# Patient Record
Sex: Female | Born: 1935
Health system: Southern US, Community
[De-identification: ages and names within clinical notes are randomized; demographics above are authoritative.]

## PROBLEM LIST (undated history)

## (undated) DIAGNOSIS — M1990 Unspecified osteoarthritis, unspecified site: Secondary | ICD-10-CM

## (undated) DIAGNOSIS — M419 Scoliosis, unspecified: Secondary | ICD-10-CM

## (undated) DIAGNOSIS — I2510 Atherosclerotic heart disease of native coronary artery without angina pectoris: Secondary | ICD-10-CM

## (undated) DIAGNOSIS — D649 Anemia, unspecified: Secondary | ICD-10-CM

## (undated) DIAGNOSIS — E039 Hypothyroidism, unspecified: Secondary | ICD-10-CM

## (undated) DIAGNOSIS — I10 Essential (primary) hypertension: Secondary | ICD-10-CM

## (undated) DIAGNOSIS — I5189 Other ill-defined heart diseases: Secondary | ICD-10-CM

## (undated) DIAGNOSIS — I519 Heart disease, unspecified: Secondary | ICD-10-CM

## (undated) DIAGNOSIS — R5383 Other fatigue: Secondary | ICD-10-CM

## (undated) DIAGNOSIS — M722 Plantar fascial fibromatosis: Secondary | ICD-10-CM

## (undated) DIAGNOSIS — R0602 Shortness of breath: Secondary | ICD-10-CM

## (undated) HISTORY — PX: OTHER SURGICAL HISTORY: SHX169

## (undated) HISTORY — DX: Scoliosis, unspecified: M41.9

## (undated) HISTORY — DX: Heart disease, unspecified: I51.9

## (undated) HISTORY — DX: Other fatigue: R53.83

## (undated) HISTORY — DX: Other ill-defined heart diseases: I51.89

## (undated) HISTORY — PX: TONSILECTOMY, ADENOIDECTOMY, BILATERAL MYRINGOTOMY AND TUBES: SHX2538

## (undated) HISTORY — DX: Plantar fascial fibromatosis: M72.2

## (undated) HISTORY — PX: ABDOMINAL HYSTERECTOMY: SHX81

## (undated) HISTORY — PX: APPENDECTOMY: SHX54

## (undated) HISTORY — DX: Atherosclerotic heart disease of native coronary artery without angina pectoris: I25.10

## (undated) HISTORY — DX: Shortness of breath: R06.02

---

## 1998-02-10 ENCOUNTER — Other Ambulatory Visit: Admission: RE | Admit: 1998-02-10 | Discharge: 1998-02-10 | Payer: Self-pay | Admitting: Dermatology

## 1999-11-27 ENCOUNTER — Other Ambulatory Visit: Admission: RE | Admit: 1999-11-27 | Discharge: 1999-11-27 | Payer: Self-pay | Admitting: Obstetrics & Gynecology

## 2001-05-15 ENCOUNTER — Encounter: Payer: Self-pay | Admitting: Internal Medicine

## 2004-11-20 ENCOUNTER — Ambulatory Visit: Payer: Self-pay | Admitting: Internal Medicine

## 2004-11-27 ENCOUNTER — Ambulatory Visit: Payer: Self-pay | Admitting: Internal Medicine

## 2005-05-23 ENCOUNTER — Encounter: Admission: RE | Admit: 2005-05-23 | Discharge: 2005-05-23 | Payer: Self-pay | Admitting: Internal Medicine

## 2005-07-11 ENCOUNTER — Encounter: Admission: RE | Admit: 2005-07-11 | Discharge: 2005-07-11 | Payer: Self-pay | Admitting: Neurology

## 2006-01-30 ENCOUNTER — Other Ambulatory Visit: Admission: RE | Admit: 2006-01-30 | Discharge: 2006-01-30 | Payer: Self-pay | Admitting: Obstetrics & Gynecology

## 2006-10-31 ENCOUNTER — Ambulatory Visit: Payer: Self-pay | Admitting: Internal Medicine

## 2006-11-14 ENCOUNTER — Ambulatory Visit: Payer: Self-pay | Admitting: Internal Medicine

## 2007-06-05 ENCOUNTER — Encounter: Admission: RE | Admit: 2007-06-05 | Discharge: 2007-06-05 | Payer: Self-pay | Admitting: Obstetrics & Gynecology

## 2007-11-27 ENCOUNTER — Encounter: Admission: RE | Admit: 2007-11-27 | Discharge: 2007-11-27 | Payer: Self-pay | Admitting: Internal Medicine

## 2009-11-14 DIAGNOSIS — D649 Anemia, unspecified: Secondary | ICD-10-CM | POA: Insufficient documentation

## 2009-11-14 DIAGNOSIS — E039 Hypothyroidism, unspecified: Secondary | ICD-10-CM | POA: Insufficient documentation

## 2009-11-14 DIAGNOSIS — M8580 Other specified disorders of bone density and structure, unspecified site: Secondary | ICD-10-CM | POA: Insufficient documentation

## 2009-11-14 DIAGNOSIS — M419 Scoliosis, unspecified: Secondary | ICD-10-CM | POA: Insufficient documentation

## 2009-11-14 DIAGNOSIS — I10 Essential (primary) hypertension: Secondary | ICD-10-CM | POA: Insufficient documentation

## 2010-02-14 ENCOUNTER — Encounter: Payer: Self-pay | Admitting: Internal Medicine

## 2010-02-22 ENCOUNTER — Telehealth (INDEPENDENT_AMBULATORY_CARE_PROVIDER_SITE_OTHER): Payer: Self-pay | Admitting: *Deleted

## 2010-02-26 ENCOUNTER — Telehealth: Payer: Self-pay | Admitting: Internal Medicine

## 2010-02-28 ENCOUNTER — Telehealth: Payer: Self-pay | Admitting: Internal Medicine

## 2010-03-29 ENCOUNTER — Telehealth: Payer: Self-pay | Admitting: Internal Medicine

## 2010-04-16 ENCOUNTER — Ambulatory Visit: Payer: Self-pay | Admitting: Internal Medicine

## 2010-04-16 DIAGNOSIS — D509 Iron deficiency anemia, unspecified: Secondary | ICD-10-CM | POA: Insufficient documentation

## 2010-04-16 DIAGNOSIS — R195 Other fecal abnormalities: Secondary | ICD-10-CM | POA: Insufficient documentation

## 2010-05-07 ENCOUNTER — Ambulatory Visit: Payer: Self-pay | Admitting: Internal Medicine

## 2010-05-07 DIAGNOSIS — K2990 Gastroduodenitis, unspecified, without bleeding: Secondary | ICD-10-CM

## 2010-05-07 DIAGNOSIS — K2970 Gastritis, unspecified, without bleeding: Secondary | ICD-10-CM | POA: Insufficient documentation

## 2010-05-17 ENCOUNTER — Telehealth: Payer: Self-pay | Admitting: Internal Medicine

## 2010-05-22 ENCOUNTER — Encounter: Payer: Self-pay | Admitting: Internal Medicine

## 2010-05-30 ENCOUNTER — Telehealth: Payer: Self-pay | Admitting: Internal Medicine

## 2010-06-01 ENCOUNTER — Ambulatory Visit: Payer: Self-pay | Admitting: Internal Medicine

## 2010-06-01 LAB — CONVERTED CEMR LAB
Basophils Absolute: 0 10*3/uL (ref 0.0–0.1)
Basophils Relative: 1 % (ref 0.0–3.0)
Lymphocytes Relative: 12.9 % (ref 12.0–46.0)
Lymphs Abs: 0.5 10*3/uL — ABNORMAL LOW (ref 0.7–4.0)
Platelets: 181 10*3/uL (ref 150.0–400.0)
RDW: 14.2 % (ref 11.5–14.6)
Saturation Ratios: 5.7 % — ABNORMAL LOW (ref 20.0–50.0)
Transferrin: 273.9 mg/dL (ref 212.0–360.0)
Vitamin B-12: 803 pg/mL (ref 211–911)
WBC: 3.6 10*3/uL — ABNORMAL LOW (ref 4.5–10.5)

## 2010-06-14 ENCOUNTER — Ambulatory Visit: Payer: Self-pay | Admitting: Internal Medicine

## 2010-06-18 ENCOUNTER — Telehealth (INDEPENDENT_AMBULATORY_CARE_PROVIDER_SITE_OTHER): Payer: Self-pay | Admitting: *Deleted

## 2010-07-09 ENCOUNTER — Telehealth: Payer: Self-pay | Admitting: Internal Medicine

## 2010-07-12 ENCOUNTER — Telehealth (INDEPENDENT_AMBULATORY_CARE_PROVIDER_SITE_OTHER): Payer: Self-pay | Admitting: *Deleted

## 2010-07-12 ENCOUNTER — Telehealth (INDEPENDENT_AMBULATORY_CARE_PROVIDER_SITE_OTHER): Payer: Self-pay

## 2010-07-18 ENCOUNTER — Ambulatory Visit: Payer: Self-pay | Admitting: Internal Medicine

## 2010-07-18 LAB — CONVERTED CEMR LAB
Eosinophils Absolute: 0.1 10*3/uL (ref 0.0–0.7)
Eosinophils Relative: 2.3 % (ref 0.0–5.0)
Lymphs Abs: 0.6 10*3/uL — ABNORMAL LOW (ref 0.7–4.0)
Neutrophils Relative %: 68.3 % (ref 43.0–77.0)
Platelets: 175 10*3/uL (ref 150.0–400.0)
RBC: 3.89 M/uL (ref 3.87–5.11)

## 2010-08-17 ENCOUNTER — Ambulatory Visit: Payer: Self-pay | Admitting: Internal Medicine

## 2010-08-17 LAB — CONVERTED CEMR LAB
Basophils Relative: 0.7 % (ref 0.0–3.0)
HCT: 38.7 % (ref 36.0–46.0)
Hemoglobin: 12.9 g/dL (ref 12.0–15.0)
Lymphocytes Relative: 16.4 % (ref 12.0–46.0)
Lymphs Abs: 0.6 10*3/uL — ABNORMAL LOW (ref 0.7–4.0)
MCHC: 33.4 g/dL (ref 30.0–36.0)
MCV: 86.9 fL (ref 78.0–100.0)
Monocytes Relative: 11.2 % (ref 3.0–12.0)
Neutro Abs: 2.6 10*3/uL (ref 1.4–7.7)
Neutrophils Relative %: 68.2 % (ref 43.0–77.0)
Platelets: 181 10*3/uL (ref 150.0–400.0)
RDW: 19.4 % — ABNORMAL HIGH (ref 11.5–14.6)
WBC: 3.9 10*3/uL — ABNORMAL LOW (ref 4.5–10.5)

## 2010-08-22 ENCOUNTER — Ambulatory Visit: Payer: Self-pay | Admitting: Internal Medicine

## 2010-08-22 DIAGNOSIS — K5520 Angiodysplasia of colon without hemorrhage: Secondary | ICD-10-CM | POA: Insufficient documentation

## 2010-11-20 NOTE — Assessment & Plan Note (Signed)
Summary: Iron deficiency anemia-followup   History of Present Illness Visit Type: Follow-up Visit Primary GI MD: Yancey Flemings MD Primary Provider: Geoffry Paradise, MD Requesting Provider: n/a Chief Complaint: F/u for labs. Pt states that she is feeling better and denies any GI complaints  History of Present Illness:   75 year old female with hypertension, hypothyroidism, anxiety disorder, and iron deficiency anemia. She was evaluated late June for Hemoccult positive stool and iron deficiency anemia. She subsequently underwent colonoscopy and upper endoscopy. Colonoscopy was normal except for diminutive polyp. The ileum was normal. Upper endoscopy was normal except for mild esophagitis. Finally, she underwent capsule endoscopy. This revealed several AVMs. One large. Nonbleeding. She was seen one month ago. Hemoglobin at that time 10.6. She was changed aren't sulfate 325 mg b.i.d. Repeat CBC and ferritin the other day were normal. Her hemoglobin was 12.9 and ferritin level 14.5. She presents today to discuss these results. She tells me that she's feeling much better. Improve strength. Improved exercise tolerance. No other issues. We reviewed in detail iron deficiency anemia as well as the significance of vascular malformations as they're related to iron deficiency anemia and the treatment algorithm.   GI Review of Systems      Denies abdominal pain, acid reflux, belching, bloating, chest pain, dysphagia with liquids, dysphagia with solids, heartburn, loss of appetite, nausea, vomiting, vomiting blood, weight loss, and  weight gain.        Denies anal fissure, black tarry stools, change in bowel habit, constipation, diarrhea, diverticulosis, fecal incontinence, heme positive stool, hemorrhoids, irritable bowel syndrome, jaundice, light color stool, liver problems, rectal bleeding, and  rectal pain.    Current Medications (verified): 1)  Diovan Hct 320-25 Mg Tabs (Valsartan-Hydrochlorothiazide) ....  Take 1 Tablet By Mouth Once A Day 2)  Synthroid 50 Mcg Tabs (Levothyroxine Sodium) .... Take 1 Tablet By Mouth     Once Daily 3)  Aspirin 81 Mg Tbec (Aspirin) .... Take 1 Tablet By Mouth Once Daily 4)  Trazodone Hcl 100 Mg Tabs (Trazodone Hcl) .... Take 1 Tablet At Bedtime For Pain 5)  Fish Oil 1000 Mg Caps (Omega-3 Fatty Acids) .... Once Daily 6)  Multivitamins  Tabs (Multiple Vitamin) .... Once Daily 7)  Ferrous Sulfate 325 (65 Fe) Mg Tabs (Ferrous Sulfate) .... One Tablet By Mouth Two Times A Day  Allergies (verified): 1)  Pcn 2)  * Ivp Dye  Past History:  Past Medical History: Scoliosis/DDD Osteopenia Hypertension Hypothyroidism Anxiety Disorder GASTRITIS (ICD-535.50) ANEMIA, IRON DEFICIENCY (ICD-280.9) Diverticulosis--Colon 2008  Hx Colon Polyps Esophagitis  Past Surgical History: Reviewed history from 04/16/2010 and no changes required. Appendectomy Hysterectomy-Abdominal Tonsillectomy & Adenoidectomy Knee Arthroscopy-right  Family History: Family History of Heart Disease: Mother-MI, deceased Family History of Lung Cancer: Father-deceased No FH of Colon Cancer:  Social History: Reviewed history from 04/16/2010 and no changes required. Married, 2 boys Retired Charity fundraiser Alcohol Use - yes - occasional  Patient has never smoked.  Daily Caffeine Use Illicit Drug Use - no  Review of Systems       The patient complains of anemia and fatigue.  The patient denies allergy/sinus, anxiety-new, arthritis/joint pain, back pain, blood in urine, breast changes/lumps, change in vision, confusion, cough, coughing up blood, depression-new, fainting, fever, headaches-new, hearing problems, heart murmur, heart rhythm changes, itching, menstrual pain, muscle pains/cramps, night sweats, nosebleeds, pregnancy symptoms, shortness of breath, skin rash, sleeping problems, sore throat, swelling of feet/legs, swollen lymph glands, thirst - excessive , urination - excessive , urination  changes/pain, urine  leakage, vision changes, and voice change.    Vital Signs:  Patient profile:   75 year old female Height:      62 inches Weight:      129 pounds BMI:     23.68 BSA:     1.59 Pulse rate:   64 / minute Pulse rhythm:   regular BP sitting:   112 / 64  (left arm) Cuff size:   regular  Vitals Entered By: Ok Anis CMA (August 22, 2010 8:33 AM)  Physical Exam  General:  Well developed, well nourished, no acute distress. Head:  Normocephalic and atraumatic. Eyes:  PERRLA, no icterus. Abdomen:  not reexamined Pulses:  Normal pulses noted. Neurologic:  Alert and  oriented x4. Skin:  normal color Psych:  Alert and cooperative. Normal mood and affect.   Impression & Recommendations:  Problem # 1:  ANGIODYSPLASIA-INTESTINE (ICD-569.84) AVMs of the small intestine as a cause for recurrent iron deficiency anemia (280.9) and Hemoccult-positive stool (792.1). She has improved on oral iron therapy. We discussed her condition in detail as mentioned in the history of present illness.  Plan #1. Continue iron sulfate 325 mg p.o. b.i.d. indefinitely #2. Have Dr. Jacky Kindle monitor your blood counts periodically, said about every 3 months, to assure stability. Sooner should she develop symptoms suggesting recurrent anemia #3. GI followup p.r.n.  Patient Instructions: 1)  Copy sent to : Geoffry Paradise, MD 2)  Please schedule a follow-up appointment as needed.  3)  The medication list was reviewed and reconciled.  All changed / newly prescribed medications were explained.  A complete medication list was provided to the patient / caregiver.

## 2010-11-20 NOTE — Assessment & Plan Note (Signed)
Summary: anemia / heme positive stool   History of Present Illness Visit Type: Initial Consult Primary GI MD: Yancey Flemings MD Primary Provider: Geoffry Paradise, MD Requesting Provider: Geoffry Paradise, MD Chief Complaint: Anemia - hem positive stool History of Present Illness:   75 year old female with a history of hypertension, hypothyroidism, anxiety disorder, and iron deficiency anemia. She is sent today regarding recurrent iron deficiency anemia and newly found Hemoccult-positive stool. The patient initially underwent colonoscopy in July of 2002 to evaluate rectal bleeding. The examination revealed diverticulosis and internal hemorrhoids. She was subsequently evaluated in February 2006 for iron deficiency anemia. Upper endoscopy was performed and found to be normal. Duodenal biopsies were negative for sprue. She was placed on iron supplements. Her iron deficiency was felt possibly due to chronic blood donation. Her blood counts improved. However, recurrent iron deficiency anemia lead to colonoscopy in January 2008. Again, the examination revealed diverticulosis. Since that time the patient has been on once daily iron. Her hemoglobin is generally in the 12-13 range. It was 12.1 earlier this year. However, in April she felt quite fatigued. Repeat hemoglobin at that time was 8.3. She does have some intermittent dark stools which he attributes to iron. She underwent Hemoccult testing on 2 separate occasions. Cards were positive x3 twice. She denies overt bleeding and GI symptoms include dosing, increased intestinal gas, and occasional nausea. No pain, mostly weight loss, or hematochezia. No family history of colon cancer. A stick check of her hemoglobin earlier today was 12.1.   GI Review of Systems    Reports belching, bloating, and  nausea.      Denies abdominal pain, acid reflux, chest pain, dysphagia with liquids, dysphagia with solids, heartburn, loss of appetite, vomiting, vomiting blood, weight  loss, and  weight gain.      Reports black tarry stools, diverticulosis, and  heme positive stool.     Denies anal fissure, change in bowel habit, constipation, diarrhea, fecal incontinence, hemorrhoids, irritable bowel syndrome, jaundice, light color stool, liver problems, rectal bleeding, and  rectal pain. Preventive Screening-Counseling & Management  Alcohol-Tobacco     Smoking Status: never      Drug Use:  no.      Current Medications (verified): 1)  Diovan Hct 320-25 Mg Tabs (Valsartan-Hydrochlorothiazide) .... Take 1 Tablet By Mouth Once A Day 2)  Synthroid 50 Mcg Tabs (Levothyroxine Sodium) .... Take 1 Tablet By Mouth     Once Daily 3)  Aspirin 81 Mg Tbec (Aspirin) .... Take 1 Tablet By Mouth Once Daily 4)  Trazodone Hcl 100 Mg Tabs (Trazodone Hcl) .... Take 1 Tablet At Bedtime For Pain 5)  Fish Oil 1000 Mg Caps (Omega-3 Fatty Acids) .... Once Daily 6)  Integra Plus  Caps (Fefum-Fepoly-Fa-B Cmp-C-Biot) .... Take 2 Capsule By Mouth Once Daily 7)  Multivitamins  Tabs (Multiple Vitamin) .... Once Daily  Allergies (verified): 1)  Pcn 2)  * Ivp Dye  Past History:  Past Medical History: Scoliosis/DDD Osteopenia Anemia Hypertension Hypothyroidism Anxiety Disorder  Past Surgical History: Appendectomy Hysterectomy-Abdominal Tonsillectomy & Adenoidectomy Knee Arthroscopy-right  Family History: Reviewed history from 04/11/2010 and no changes required. 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  Daily Caffeine Use Illicit Drug Use - no Smoking Status:  never Drug Use:  no  Review of Systems       The patient complains of arthritis/joint  pain, back pain, fatigue, and shortness of breath.  The patient denies allergy/sinus, anemia, anxiety-new, blood in urine, breast changes/lumps, change in vision, confusion,  cough, coughing up blood, depression-new, fainting, fever, headaches-new, hearing problems, heart murmur, heart rhythm changes, itching, menstrual pain, muscle pains/cramps, night sweats, nosebleeds, pregnancy symptoms, skin rash, sleeping problems, sore throat, swelling of feet/legs, swollen lymph glands, thirst - excessive , urination - excessive , urination changes/pain, urine leakage, vision changes, and voice change.    Vital Signs:  Patient profile:   75 year old female Height:      62 inches Weight:      125.38 pounds BMI:     23.02 Pulse rate:   76 / minute Pulse rhythm:   regular BP sitting:   120 / 70  (left arm) Cuff size:   regular  Vitals Entered By: June McMurray CMA Duncan Dull) (April 16, 2010 1:29 PM)  Physical Exam  General:  Well developed, well nourished, no acute distress. Head:  Normocephalic and atraumatic. Eyes:  PERRLA, no icterus.conjunctiva pink Nose:  No deformity, discharge,  or lesions. Mouth:  No deformity or lesions, Neck:  Supple; no masses or thyromegaly. Lungs:  Clear throughout to auscultation. Heart:  Regular rate and rhythm; no murmurs, rubs,  or bruits. Abdomen:  Soft, nontender and nondistended. No masses, hepatosplenomegaly or hernias noted. Normal bowel sounds. Rectal:  deferred until colonoscopy Msk:  Symmetrical with no gross deformities. Normal posture. Pulses:  Normal pulses noted. Extremities:  No clubbing, cyanosis, edema or deformities noted. Neurologic:  Alert and  oriented x4 Skin:  Intact without significant lesions or rashes. Psych:  Alert and cooperative. Normal mood and affect.   Impression & Recommendations:  Problem # 1:  ANEMIA, IRON DEFICIENCY (ICD-280.9) recurrent iron deficiency anemia. Now with Hemoccult positive stool (792.1). Prior upper endoscopy and colonoscopy as described. Significant change in hemoglobin during the first part of this year, question subacute bleeding. Concerned that she may have occult small bowel  lesion. Need to reevaluate the entire gastrointestinal tract.  Plan: #1. Colonoscopy. The nature of the procedure as well as the risks, benefits, and alternatives were reviewed. She understood and agreed to proceed #2. Movi prep prescribed. The patient instructed on its use. #3. Upper endoscopy. The nature of the procedure as well as the risks, benefits, and alternatives were reviewed. She understood and agreed to proceed. #4. Obtain tissue transglutaminase antibody and serum IgA level to screen for sprue #5. If the above workup negative, then capsule endoscopy. We discussed this as well.  Problem # 2:  FECAL OCCULT BLOOD (ICD-792.1) see the above discussion  Other Orders: T-Tissue Transglutamase Ab IgA (36644-03474) TLB-IgA (Immunoglobulin A) (82784-IGA) Colon/Endo (Colon/Endo)  Patient Instructions: 1)  Colon/Endo [facility] 05/07/10 2:30 pm arrive at 1:30 pm 2)  Hold Iron 1 week prior 3)  Colonoscopy and Flexible Sigmoidoscopy brochure given.  4)  Upper Endoscopy brochure given.  5)  Labs ordered for you to have drawn today on basement floor. 6)  Movi prep instructions given and Movi prep Rx. sent to pharmacy. 7)  Copy sent to : Geoffry Paradise, MD 8)  The medication list was reviewed and reconciled.  All changed / newly prescribed medications were explained.  A complete medication list was provided to the patient / caregiver. Prescriptions: MOVIPREP 100 GM  SOLR (PEG-KCL-NACL-NASULF-NA ASC-C) As per prep instructions.  #1 x 0   Entered by:   Milford Cage NCMA   Authorized by:   Hilarie Fredrickson MD   Signed  by:   Milford Cage NCMA on 04/16/2010   Method used:   Electronically to        [facility]. [phone]* (retail)       [phone]       [place], [place]  [address]       Ph: [phone]       Fax: [phone]   RxID:   [id number]

## 2010-11-20 NOTE — Procedures (Signed)
Summary: EGD   EGD  Procedure date:  11/27/2004  Findings:      Findings: Normal  Location: [facility]   Patient Name: Monique Wright, Monique Wright MRN:  Procedure Procedures: Panendoscopy (EGD) CPT: 43235.  Personnel: Endoscopist: Wilhemina Bonito. Marina Goodell, MD.  Referred By: Freddy Finner, M.D.  Exam Location: Exam performed in Outpatient Clinic. Outpatient  Patient Consent: Procedure, Alternatives, Risks and Benefits discussed, consent obtained, from patient. Consent was obtained by the RN.  Indications  Evaluation of: Anemia,  with low ferritin. with low iron saturation. Microcytic.  History  Current Medications: Patient is not currently taking Coumadin.  Pre-Exam Physical: Performed Nov 27, 2004  Entire physical exam was normal.  Exam Exam Info: Maximum depth of insertion Duodenum, intended Duodenum. Patient position: on left side. Vocal cords visualized. Gastric retroflexion performed. Images taken. ASA Classification: II. Tolerance: excellent.  Sedation Meds: Patient assessed and found to be appropriate for moderate (conscious) sedation. Fentanyl 50 mcg. given IV. Versed 7 mg. given IV. Cetacaine Spray given aerosolized.  Monitoring: BP and pulse monitoring done. Oximetry used. Supplemental O2 given  Findings Normal: Proximal Esophagus to Jejunum.    Comments: Biopsies d2/d3 taken to r/o Sprue Assessment Normal examination.  Events  Unplanned Intervention: No unplanned interventions were required.  Unplanned Events: There were no complications. Plans Comments: Iron tid with meals Disposition: After procedure patient sent to recovery. After recovery patient sent home.  Comments: RETURN TO DR. Jacky Kindle OR [facility] TO MONITOR BLOOD COUNTS ON IRON  CC:   Geoffry Paradise, MD   Konrad Dolores, MD  This report was created from the original endoscopy report, which was reviewed and signed by the above listed endoscopist.

## 2010-11-20 NOTE — Miscellaneous (Signed)
Summary: Orders Update-Clotest  Clinical Lists Changes  Problems: Added new problem of GASTRITIS (ICD-535.50) Orders: Added new Test order of TLB-H Pylori Screen Gastric Biopsy (83013-CLOTEST) - Signed 

## 2010-11-20 NOTE — Progress Notes (Signed)
Summary: Reschedule REV  Phone Note Outgoing Call Call back at [facility] Phone [phone]   Call placed by: Darcey Nora RN, CGRN,  July 12, 2010 8:25 AM Call placed to: Patient Summary of Call: Left message for patient to call back to discuss her appointment on 07/18/10.  She was placed on the wrong schedule and I have had to reschedule her to 07/18/10 at 4:00.  I have asked her to call back and confirm she got the message. Initial call taken by: Darcey Nora RN, CGRN,  July 12, 2010 8:27 AM  Follow-up for Phone Call        patient's husband aware of the change in schedule. Follow-up by: Darcey Nora RN, CGRN,  July 12, 2010 3:10 PM

## 2010-11-20 NOTE — Progress Notes (Signed)
Summary: Triage  Phone Note From Other Clinic   Caller: B.J. @ [facility] [phone] Call For: Dr. Marina Goodell Summary of Call: Would like to speak directly with nurse about this pt. Initial call taken by: Karna Christmas,  March 29, 2010 1:00 PM  Follow-up for Phone Call        returned call the office was closed for lunch.  Chales Abrahams CMA Duncan Dull)  March 29, 2010 1:46 PM   Spoke with BJ at [facility] she has gotten calls from the pt stating that there office nor ours has done anything to help the pt.  BJ was inquiring if an appt was made with Dr Marina Goodell.  I explained that an appt was made for the pt for 03/27/10.  The pt called back and rescheduled for 04/16/10.  The pt then did not want to wait that long for the appt but per Dr Marina Goodell the appt was not changed because that was the first available and the pt had cx her earlier appt.  BJ thanked me for my help. Follow-up by: Chales Abrahams CMA Duncan Dull),  March 29, 2010 2:25 PM

## 2010-11-20 NOTE — Procedures (Signed)
Summary: CAPSULE ENDOSCOPY REPORT   ORDERED BY: Yancey Flemings, MD READ BY: Yancey Flemings, MD  REASON FOR REFERRAL: 74YO FEMALE WITH AN IRON DEFICIENCY ANEMIA, AND HEME POSITIVE STOOL.  PROCEDURE INFO & FINDINGS: 1)COMPLETE STUDY, GOOD PREP OVERALL 2)SEVERAL TINY AVMS, ONE LARGE AVM AT 47 MINUTES, AND ONE SMALL AVM AT 2 HOURS, 34 MINUTES.  SUMMARY & RECOMMENDATIONS: 1.WILL NEED CHRONIC IRON THERAPY AND PERIODIC CHECK OF H/H. 2.CONSIDER ENTEROSCOPY AND ABLATION OF THE LARGE AVM IF PATIENT DEVELOPS SIGNIFICANT ANEMIA REFRACTORY TO IRON OR OVERT BLEEDING. 3.OFFICE FOLLOW UP WITH DR Marina Goodell CC:DR. RICHARD ARONSON   This report was created from the original endoscopy report, which was reviewed and signed by the above listed endoscopist.

## 2010-11-20 NOTE — Letter (Signed)
Summary: [facility] Instructions  [facility]  [address] [place], [place] [address]   Phone: [phone]  Fax: [phone]       Monique Wright    [birth year]    MRN: [id number]        Procedure Day /Date:MONDAY, 05/07/10     Arrival Time:1:30 PM     Procedure Time:2:30 PM     Location of Procedure:                    X  [facility] (4th Floor)                        PREPARATION FOR COLONOSCOPY WITH MOVIPREP/ENDO   Starting 5 days prior to your procedure 05/02/10 do not eat nuts, seeds, popcorn, corn, beans, peas,  salads, or any raw vegetables.  Do not take any fiber supplements (e.g. Metamucil, Citrucel, and Benefiber).  THE DAY BEFORE YOUR PROCEDURE         DATE: 05/06/10  DAY: SUNDAY  1.  Drink clear liquids the entire day-NO SOLID FOOD  2.  Do not drink anything colored red or purple.  Avoid juices with pulp.  No orange juice.  3.  Drink at least 64 oz. (8 glasses) of fluid/clear liquids during the day to prevent dehydration and help the prep work efficiently.  CLEAR LIQUIDS INCLUDE: Water Jello Ice Popsicles Tea (sugar ok, no milk/cream) Powdered fruit flavored drinks Coffee (sugar ok, no milk/cream) Gatorade Juice: apple, white grape, white cranberry  Lemonade Clear bullion, consomm, broth Carbonated beverages (any kind) Strained chicken noodle soup Hard Candy                             4.  In the morning, mix first dose of MoviPrep solution:    Empty 1 Pouch A and 1 Pouch B into the disposable container    Add lukewarm drinking water to the top line of the container. Mix to dissolve    Refrigerate (mixed solution should be used within 24 hrs)  5.  Begin drinking the prep at 5:00 p.m. The MoviPrep container is divided by 4 marks.   Every 15 minutes drink the solution down to the next mark (approximately 8 oz) until the full liter is complete.   6.  Follow completed prep with 16 oz of clear liquid of your choice (Nothing  red or purple).  Continue to drink clear liquids until bedtime.  7.  Before going to bed, mix second dose of MoviPrep solution:    Empty 1 Pouch A and 1 Pouch B into the disposable container    Add lukewarm drinking water to the top line of the container. Mix to dissolve    Refrigerate  THE DAY OF YOUR PROCEDURE      DATE:05/07/10 DAY: MONDAY  Beginning at 9:30 a.m. (5 hours before procedure):         1. Every 15 minutes, drink the solution down to the next mark (approx 8 oz) until the full liter is complete.  2. Follow completed prep with 16 oz. of clear liquid of your choice.    3. You may drink clear liquids until12:30 PM (2 HOURS BEFORE PROCEDURE).   MEDICATION INSTRUCTIONS  Unless otherwise instructed, you should take regular prescription medications with a small sip of water   as early as possible the morning of your procedure.  HOLD IRON 1  WEEK PRIOR         OTHER INSTRUCTIONS  You will need a responsible adult at least 73 years of age to accompany you and drive you home.   This person must remain in the waiting room during your procedure.  Wear loose fitting clothing that is easily removed.  Leave jewelry and other valuables at home.  However, you may wish to bring a book to read or  an iPod/MP3 player to listen to music as you wait for your procedure to start.  Remove all body piercing jewelry and leave at home.  Total time from sign-in until discharge is approximately 2-3 hours.  You should go home directly after your procedure and rest.  You can resume normal activities the  day after your procedure.  The day of your procedure you should not:   Drive   Make legal decisions   Operate machinery   Drink alcohol   Return to work  You will receive specific instructions about eating, activities and medications before you leave.    The above instructions have been reviewed and explained to me by   _______________________    I fully understand  and can verbalize these instructions _____________________________ Date _________

## 2010-11-20 NOTE — Progress Notes (Signed)
Summary: Needs Labs  Phone Note Outgoing Call   Summary of Call: Pt. ntfd. that her [employer] has not approved payment for her capsule endo for tomorrow per Morrie Sheldon so cx. until pre-certification is rec'd. Initial call taken by: Teryl Lucy RN,  May 30, 2010 2:15 PM  Follow-up for Phone Call        Spoke with Deanna Artis, RN with [facility], She apologized for not contacting our office, it was on her "to do list." Cannot approve Capsule Endo without labs. Requesting iron studies over CBC, she stated H&H would need to be critically low to obtain approval unless we could show consistently low levels or significant drop. No relevant labs in EMR. Please obtain Iron Studies and then we can submit for authorization. Follow-up by: Dwan Bolt,  May 30, 2010 4:52 PM  Additional Follow-up for Phone Call Additional follow up Details #1::        Please  copy the Labs from Dr Jacky Kindle from 4,/27/2011, Hgb 8.7 and have pt come in for CBC,Fe,TIBC and B12. Additional Follow-up by: Hart Carwin MD,  May 30, 2010 5:47 PM    Additional Follow-up for Phone Call Additional follow up Details #2::    Message left for pt. to call me x2 this a.m.  Teryl Lucy RN  May 31, 2010 9:43 AM message left again to call back.   Teryl Lucy RN  May 31, 2010 3:07 PM Pt. ret'd. my call and will come this a.m. for addtional lab work requested by her insurance.Teryl Lucy RN  June 01, 2010 8:28 AM   Additional Follow-up for Phone Call Additional follow up Details #3:: Details for Additional Follow-up Action Taken: labs confirm iron deficiency, despite being on iron therapy! She has very signif anemia previously documented as well as heme positive stools! With unrevealing ECL she needs capsule endoscopy. Hilarie Fredrickson MD  June 01, 2010 1:15 PM   Labs were faxed by Morrie Sheldon to pt's. insurance co. as soon as results became available today. C.Lohr,R.N.Insurance appvd capsule and it is r/s to 06/14/10.Pt. aware.06/05/2010

## 2010-11-20 NOTE — Procedures (Signed)
Summary: Colonoscopy: Diverticulosis   Colonoscopy  Procedure date:  11/14/2006  Findings:      Results: Diverticulosis.       Location:  [facility].   Patient Name: Monique Wright, Monique Wright MRN:  Procedure Procedures: Colonoscopy CPT: [phone].  Personnel: Endoscopist: Wilhemina Bonito. Marina Goodell, MD.  Referred By: Pearletha Furl Jacky Kindle, MD.  Exam Location: Exam performed in Outpatient Clinic. Outpatient  Patient Consent: Procedure, Alternatives, Risks and Benefits discussed, consent obtained, from patient. Consent was obtained by the RN.  Indications  Evaluation of: Anemia with low iron saturation. Microcytic.  History  Current Medications: Patient is not currently taking Coumadin.  Pre-Exam Physical: Performed Nov 14, 2006. Cardio-pulmonary exam, Rectal exam, HEENT exam , Abdominal exam, Mental status exam WNL.  Comments: Pt. history reviewed/updated, physical exam performed prior to initiation of sedation?yes Exam Exam: Extent of exam reached: Cecum, extent intended: Cecum.  The cecum was identified by appendiceal orifice and IC valve. Patient position: on left side. Time to Cecum: 00:09:58. Time for Withdrawl: 00:22:15. Colon retroflexion performed. Images taken. ASA Classification: II. Tolerance: excellent.  Monitoring: Pulse and BP monitoring, Oximetry used. Supplemental O2 given.  Colon Prep Used Miralax for colon prep. Prep results: fair, adequate exam.  Sedation Meds: Patient assessed and found to be appropriate for moderate (conscious) sedation. Fentanyl 75 mcg. given IV. Versed 8 mg. given IV.  Findings NORMAL EXAM: Cecum to Rectum.  - DIVERTICULOSIS: Descending Colon to Sigmoid Colon. ICD9: Diverticulosis, Colon: 562.10. Comments: mild.   Assessment  Diagnoses: 562.10: Diverticulosis, Colon.   Comments: NO POLYPS OR AVM'S Events  Unplanned Interventions: No intervention was required.  Unplanned Events: There were no  complications. Plans Disposition: After procedure patient sent to recovery. After recovery patient sent home.  Scheduling/Referral: Follow-Up prn.  Comments: RETURN TO THE CARE OF DR. Jacky Kindle  CC:   Geoffry Paradise, MD  This report was created from the original endoscopy report, which was reviewed and signed by the above listed endoscopist.

## 2010-11-20 NOTE — Procedures (Signed)
Summary: Colonoscopy: Diverticulosis, Hemorrhoids   Colonoscopy  Procedure date:  05/15/2001  Findings:      Results: Hemorrhoids.     Results: Diverticulosis.       Location:  [facility].   Patient Name: Monique Wright, Monique Wright MRN:  Procedure Procedures: Colonoscopy CPT: [phone].  Personnel: Endoscopist: Wilhemina Bonito. Marina Goodell, MD.  Referred By: Pearletha Furl Jacky Kindle, MD.  Exam Location: Exam performed in Outpatient Clinic. Outpatient  Patient Consent: Procedure, Alternatives, Risks and Benefits discussed, consent obtained, from patient.  Indications Symptoms: Hematochezia.  Average Risk Screening Routine.  History  Pre-Exam Physical: Performed May 15, 2001. Cardio-pulmonary exam, Rectal exam, HEENT exam , Abdominal exam, Extremity exam, Neurological exam, Mental status exam WNL.  Exam Exam: Extent of exam reached: Cecum, extent intended: Cecum.  The cecum was identified by appendiceal orifice and IC valve. Patient position: on left side. Colon retroflexion performed. Images taken. ASA Classification: II. Tolerance: excellent.  Monitoring: Pulse and BP monitoring, Oximetry used. Supplemental O2 given.  Colon Prep Used Golytely for colon prep. Prep results: good.  Sedation Meds: Patient assessed and found to be appropriate for moderate (conscious) sedation. Fentanyl 100 mcg. Versed 7 mg.  Findings - DIVERTICULOSIS: Descending Colon to Sigmoid Colon. ICD9: Diverticulosis, Colon: 562.10.  NORMAL EXAM: Cecum.  HEMORRHOIDS: Internal. ICD9: Hemorrhoids, Internal: 455.0.   Assessment Abnormal examination, see findings above.  Diagnoses: 562.10: Diverticulosis, Colon.  455.0: Hemorrhoids, Internal.   Events  Unplanned Interventions: No intervention was required.  Unplanned Events: There were no complications. Plans Patient Education: Patient given standard instructions for: Diverticulosis.  Disposition: After procedure patient sent to recovery. After  recovery patient sent home.  Scheduling/Referral: Colonoscopy, to Wilhemina Bonito. Marina Goodell, MD, at age 75 if medically fit,   Comments: Return to the care of Dr Jacky Kindle   CC:   Geoffry Paradise, MD  This report was created from the original endoscopy report, which was reviewed and signed by the above listed endoscopist.

## 2010-11-20 NOTE — Progress Notes (Signed)
  Phone Note Outgoing Call   Summary of Call: Per request of Amy Esterwoo,P.A  I checked  with pt's husband and she is taking her iron b.i.d. Initial call taken by: Teryl Lucy RN,  July 12, 2010 3:29 PM     Appended Document:  Needs CBCD prior to office visit per Amy Esterwoo,N.P.  Appended Document:  Pt. will come for lab work in am of her rov.

## 2010-11-20 NOTE — Procedures (Signed)
Summary: Colonoscopy  Patient: Monique Wright Note: All result statuses are Final unless otherwise noted.  Tests: (1) Colonoscopy (COL)   COL Colonoscopy           DONE     [facility]     [address]     [place], [place]  [address]           COLONOSCOPY PROCEDURE REPORT           PATIENT:  Monique Wright, Monique Wright  MR#:  [id number]     BIRTHDATE:  [birth year], 74 yrs. old  GENDER:  female     ENDOSCOPIST:  Wilhemina Bonito. Eda Keys, MD     REF. BY:  Office     PROCEDURE DATE:  05/07/2010     PROCEDURE:  Colonoscopy with snare polypectomy X 1     ASA CLASS:  Class II     INDICATIONS:  Iron deficiency anemia, heme positive stool     MEDICATIONS:   Fentanyl 100 mcg IV, Versed 10 mg IV           DESCRIPTION OF PROCEDURE:   After the risks benefits and     alternatives of the procedure were thoroughly explained, informed     consent was obtained.  Digital rectal exam was performed and     revealed no abnormalities.   The LB [id number]-[id number] endoscope     was introduced through the anus and advanced to the cecum, which     was identified by both the appendix and ileocecal valve, without     limitations. TIME TO CECUM = 4:55 MIN. The quality of the prep was     good, using MoviPrep.  The instrument was then slowly withdrawn     (T= 16:27 MIN) as the colon was fully examined.     <<PROCEDUREIMAGES>>           FINDINGS:  A < 1mm polyp was found in the rectum. Polyp was snared     without cautery. Retrieval was unsuccessful as the polyp was too     tiny.  A normal appearing cecum, ileocecal valve, and appendiceal     orifice were identified. The ascending, hepatic flexure,     transverse, splenic flexure, descending, sigmoid colon, and rectum     appeared otherwise unremarkable.  The terminal ileum appeared     normal.   Retroflexed views in the rectum revealed no     abnormalities.    The scope was then withdrawn from the patient     and the procedure completed.        COMPLICATIONS:  None     ENDOSCOPIC IMPRESSION:     1) Diminutive polyp in the rectum - removed     2) Normal colon otherwise     3) Normal terminal ileum     RECOMMENDATIONS:     1) Follow up colonoscopy prn basis     2) Upper endoscopy will be performed today           ______________________________     Wilhemina Bonito. Eda Keys, MD           CC:  Geoffry Paradise, MD; The Patient           n.     eSIGNED:   Wilhemina Bonito. Eda Keys at 05/07/2010 03:19 PM           Rometta Emery, [id number]  Note: An exclamation mark (!) indicates a result that was not dispersed  into the flowsheet. Document Creation Date: 05/07/2010 3:21 PM _______________________________________________________________________  (1) Order result status: Final Collection or observation date-time: 05/07/2010 15:00 Requested date-time:  Receipt date-time:  Reported date-time:  Referring Physician:   Ordering Physician: Fransico Setters [phone]) Specimen Source:  Source: Launa Grill Order Number: [id number] Lab site:

## 2010-11-20 NOTE — Progress Notes (Signed)
Summary: triage / appt change  Phone Note Call from Patient Call back at [phone]   Caller: Patient Call For: Dr. Marina Goodell Summary of Call: pt says she will be oot for her currently sch'ed appt and would like to resch... offered next available, but pt said June was too long to wait Initial call taken by: Vallarie Mare,  Feb 28, 2010 9:32 AM  Follow-up for Phone Call        Pt's appt for 03/27/10 r/s per pt. request and she was given first avaiable appt. for 04/16/10. Follow-up by: Teryl Lucy RN,  Feb 28, 2010 11:27 AM  Additional Follow-up for Phone Call Additional follow up Details #1::        sorry but that's what is available since she cx her appt Additional Follow-up by: Hilarie Fredrickson MD,  Mar 01, 2010 9:13 AM

## 2010-11-20 NOTE — Miscellaneous (Signed)
Summary: capsule endo order  Clinical Lists Changes  Orders: Added new Test order of Capsule Endoscopy (Capsule Endoscopy) - Signed 

## 2010-11-20 NOTE — Progress Notes (Signed)
Summary: results request  Phone Note Call from Patient Call back at [phone]   Caller: Patient Call For: Dr. Marina Goodell Reason for Call: Talk to Nurse Summary of Call: would like results from capsule endo Initial call taken by: Vallarie Mare,  July 09, 2010 1:41 PM  Follow-up for Phone Call        reviewed results and recommendations from Capsule report with the patient.  She is advised to remain on iron REV scheduled for 07/18/10 11:30 Follow-up by: Darcey Nora RN, CGRN,  July 09, 2010 1:59 PM

## 2010-11-20 NOTE — Progress Notes (Signed)
Summary: FYI  Phone Note Call from Patient Call back at [facility] Phone [phone]   Caller: Patient Call For: Dr Marina Goodell Reason for Call: Talk to Nurse Summary of Call: Patient calling to let you know that she passed the capsule pill. Initial call taken by: Tawni Levy,  June 18, 2010 10:45 AM  Follow-up for Phone Call        Noted. Follow-up by: Joselyn Glassman,  June 18, 2010 2:39 PM

## 2010-11-20 NOTE — Procedures (Signed)
Summary: 280.9  Patient here today for capsule endoscopy for Dr. Marina Goodell .  Pt verbalized understanding of all verbal and written instructions.  Pt tolerated well.  Lot #  [id number]  25  exp 2012-07 .

## 2010-11-20 NOTE — Procedures (Signed)
Summary: Upper Endoscopy  Patient: Monique Wright Note: All result statuses are Final unless otherwise noted.  Tests: (1) Upper Endoscopy (EGD)   EGD Upper Endoscopy       DONE     [facility]     [address]     [place], [place]  [address]           ENDOSCOPY PROCEDURE REPORT           PATIENT:  [name], [name]  MR#:  [id number]     BIRTHDATE:  01/31/1936, 74 yrs. old  GENDER:  female           ENDOSCOPIST:  Wilhemina Bonito. Eda Keys, MD     Referred by:  Office           PROCEDURE DATE:  05/07/2010     PROCEDURE:  EGD with biopsy     ASA CLASS:  Class II     INDICATIONS:  iron deficiency anemia, FOBT + stool           MEDICATIONS:   There was residual sedation effect present from     prior procedure., Versed 2 mg IV     TOPICAL ANESTHETIC:  Exactacain Spray           DESCRIPTION OF PROCEDURE:   After the risks benefits and     alternatives of the procedure were thoroughly explained, informed     consent was obtained.  The [facility] [id number]-H180 [id number] endoscope was     introduced through the mouth and advanced to the third portion of     the duodenum, without limitations.  The instrument was slowly     withdrawn as the mucosa was fully examined.     <<PROCEDUREIMAGES>>           Esophagitis, as manifested by a single erosion and mild edema,was     found in the distal esophagus. The esophagus was otherwise normal.     The stomach was entered and closely examined. The antrum,     angularis, and lesser curvature were well visualized, including a     retroflexed view of the cardia and fundus. There was mild     nonspecific erythema in the antrum. CLO bx taken.The stomach wall     was normally distensable. The scope passed easily through the     pylorus into the duodenum.  The duodenal bulb was normal in     appearance, as was the postbulbar duodenum to the 4th potion.     Retroflexed views revealed no abnormalities.    The scope was then     withdrawn from the patient and the  procedure completed.           COMPLICATIONS:  None           ENDOSCOPIC IMPRESSION:     1) Mild Esophagitis in the distal esophagus     2) Normal stomach w/ mild antral erythema     3) Normal duodenum to D4     4) Esophagitis could explain heme + stool. Not clear this is     enough to explain iron deficiency anemia           RECOMMENDATIONS:     1) Rx CLO if positive     2) Stay on iron therapy     3) MY OFFICE WILL ARRANGE CAPSULE ENDOSCOPY TO FURTHER EVALUATE     THE NONVISUALIZED SMALL BOWEL FOR POSSIBLE ABNORMALITIES  ______________________________     Wilhemina Bonito. Eda Keys, MD           CC:  Geoffry Paradise, MD, The Patient           n.     eSIGNED:   Wilhemina Bonito. Eda Keys at 05/07/2010 03:34 PM           Rometta Emery, [id number]  Note: An exclamation mark (!) indicates a result that was not dispersed into the flowsheet. Document Creation Date: 05/07/2010 3:36 PM _______________________________________________________________________  (1) Order result status: Final Collection or observation date-time: 05/07/2010 15:12 Requested date-time:  Receipt date-time:  Reported date-time:  Referring Physician:   Ordering Physician: Fransico Setters [phone]) Specimen Source:  Source: Launa Grill Order Number: [id number] Lab site:

## 2010-11-20 NOTE — Progress Notes (Signed)
  Phone Note From Other Clinic   Summary of Call: Request rec'd from Dr.Aronson for  appt.soon per Diane.His notes and pt's records reviewed by Dr.Perry. Diane ntfd. of Dr.Perry's recommendations:Hemoccult cards x3/Iron by mouth t.i.d./Ask pt. if she is still a blood donor/ov 03/27/10 at 10:15/call for bleeding Initial call taken by: Teryl Lucy RN,  Feb 22, 2010 2:11 PM

## 2010-11-20 NOTE — Progress Notes (Signed)
Summary: pos hem cards  Phone Note Call from Patient Call back at [phone]   Caller: Patient Call For: Dr. Marina Goodell Reason for Call: Talk to Nurse Summary of Call: pt states that all of her hem cards were positive and she was instructed by Elnita Maxwell to sch an appt asap if they were positive Initial call taken by: Vallarie Mare,  Feb 26, 2010 10:55 AM  Follow-up for Phone Call        Talked with pt.  She request that Elnita Maxwell call her back on 02/27/10 at [phone].   Follow-up by: Ashok Cordia RN,  Feb 26, 2010 12:50 PM  Additional Follow-up for Phone Call Additional follow up Details #1::        Pt. called me back as I had left her a message.. Additional Follow-up by: Teryl Lucy RN,  Mar 01, 2010 4:44 PM    Additional Follow-up for Phone Call Additional follow up Details #2::    Got  hemoccults at the dr's office at [employer] where she works. Did them  over the week-end and read  by the nurse .I requested the results be faxed for Dr. Lamar Sprinkles review when he returns to the office on 03/05/10. Pt. informed me that she had been on iron daily for a long time and it had been increased to b.i.d. one week  prior and so she started three times a day iron on 02/22/10 Records sent from ov 02/16/10 and reviewed on 02/22/10 by Dr.Perry did not list iron on med. list.  .Pt. instructed to contact the hemoccult card company  they use and ask if there could be a false positive since she was all ready on iron as the company used thru our lab can give a false pos. on iron.She will do so.Dr.Aronson's office did notify her re: Dr.Perry's recommendations. and she is aware to to call for bleeding and other concerns can  are  be addressed with Dr.Aronson. until seen. Follow-up by: Teryl Lucy RN,  Feb 27, 2010 4:01 PM  Additional Follow-up for Phone Call Additional follow up Details #3:: Details for Additional Follow-up Action Taken: keep f/u appt; Also, please populate EMR with relevant GI records /  reports  Elnita Maxwell- What do I need to do with this patient?  Note was sent to me.This report was created from the original endoscopy report, which was reviewed and signed by the above listed endoscopist.   Additional Follow-up by: Hilarie Fredrickson MD,  Mar 01, 2010 9:33 AM  Disregard-Barbara sent it  to you in error.

## 2010-11-20 NOTE — Progress Notes (Signed)
Summary: Needs a Capsule Endo scheduled  Phone Note Call from Patient Call back at Home Phone [phone]   Call For: DR Retal Tonkinson Summary of Call: Was told she would be contacted to have a Capsule Endo and has not heard anything from office. Initial call taken by: Leanor Kail [facility],  May 17, 2010 3:28 PM  Follow-up for Phone Call        Pt. informed that I will have Dr.Jakorey Mcconathy complete paperwork when he returns to office on Monday and then we will get her scheduled. Follow-up by: Teryl Lucy RN,  May 17, 2010 3:48 PM     Appended Document: Needs a Capsule Endo scheduled Message left with husband to have pt. call back to schedule capsule endoscopy  Appended Document: Needs a Capsule Endo scheduled Pt. scheduled for capsule endo next wk. She will pick up instructions on Thursday.Patty will review instuctions with her.

## 2010-11-20 NOTE — Assessment & Plan Note (Signed)
Summary: Capsule Endo followup   History of Present Illness Visit Type: Follow-up Visit Primary GI MD: Yancey Flemings MD Primary Syre Knerr: Geoffry Paradise, MD Requesting Warrick Llera: n/a Chief Complaint: f/u Capsule pt states she feels about the same History of Present Illness:   75 year old female with hypertension, hypothyroidism, anxiety disorder, and iron deficiency anemia. She was evaluated April 16, 2010 regarding anemia and Hemoccult-positive stool. Anemia was felt to be recurrent iron deficiency. She subsequently underwent colonoscopy and upper endoscopy. Colonoscopy was normal except for a diminutive rectal polyp. The ileum was normal. Upper endoscopy was also normal except for mild esophagitis. She socially underwent capsule endoscopy. This revealed several arteriovenous malformations. One large. No active bleeding. She has been on Integra plus supplemental with 125 mg of iron. She is accompanied by her husband. No GI complaints. Does complain of nonspecific fatigue as well as shortness of breath with exertion. Hemoglobin this week 10.6. No significant change.   GI Review of Systems      Denies abdominal pain, acid reflux, belching, bloating, chest pain, dysphagia with liquids, dysphagia with solids, heartburn, loss of appetite, nausea, vomiting, vomiting blood, weight loss, and  weight gain.        Denies anal fissure, black tarry stools, change in bowel habit, constipation, diarrhea, diverticulosis, fecal incontinence, heme positive stool, hemorrhoids, irritable bowel syndrome, jaundice, light color stool, liver problems, rectal bleeding, and  rectal pain.    Current Medications (verified): 1)  Diovan Hct 320-25 Mg Tabs (Valsartan-Hydrochlorothiazide) .... Take 1 Tablet By Mouth Once A Day 2)  Synthroid 50 Mcg Tabs (Levothyroxine Sodium) .... Take 1 Tablet By Mouth     Once Daily 3)  Aspirin 81 Mg Tbec (Aspirin) .... Take 1 Tablet By Mouth Once Daily 4)  Trazodone Hcl 100 Mg Tabs  (Trazodone Hcl) .... Take 1 Tablet At Bedtime For Pain 5)  Fish Oil 1000 Mg Caps (Omega-3 Fatty Acids) .... Once Daily 6)  Integra Plus  Caps (Fefum-Fepoly-Fa-B Cmp-C-Biot) .... Take 2 Capsule By Mouth Once Daily 7)  Multivitamins  Tabs (Multiple Vitamin) .... Once Daily  Allergies (verified): 1)  Pcn 2)  * Ivp Dye  Past History:  Past Medical History: Reviewed history from 04/16/2010 and no changes required. Scoliosis/DDD Osteopenia Anemia Hypertension Hypothyroidism Anxiety Disorder  Past Surgical History: Reviewed history from 04/16/2010 and no changes required. Appendectomy Hysterectomy-Abdominal Tonsillectomy & Adenoidectomy Knee Arthroscopy-right  Family History: Reviewed history from 04/11/2010 and no changes required. Family History of Heart Disease: Mother-MI, deceased Family History of Lung Cancer: Father-deceased  Social History: Reviewed history from 04/16/2010 and no changes required. Married, 2 boys Retired Charity fundraiser Alcohol Use - yes - occasional  Patient has never smoked.  Daily Caffeine Use Illicit Drug Use - no  Review of Systems       The patient complains of fatigue.  The patient denies allergy/sinus, anemia, anxiety-new, arthritis/joint pain, back pain, blood in urine, breast changes/lumps, change in vision, confusion, cough, coughing up blood, depression-new, fainting, fever, headaches-new, hearing problems, heart murmur, heart rhythm changes, itching, menstrual pain, muscle pains/cramps, night sweats, nosebleeds, pregnancy symptoms, shortness of breath, skin rash, sleeping problems, sore throat, swelling of feet/legs, swollen lymph glands, thirst - excessive , urination - excessive , urination changes/pain, urine leakage, vision changes, and voice change.    Vital Signs:  Patient profile:   74 year old female Height:      62 inches Weight:      127 pounds BMI:     23.31 Pulse rate:  72 / minute BP sitting:   118 / 70  (right arm)  Vitals Entered  By: Chales Abrahams CMA Duncan Dull) (July 18, 2010 4:09 PM)  Physical Exam  General:  Well developed, well nourished, no acute distress. Head:  Normocephalic and atraumatic. Eyes:  PERRLA, no icterus. Lungs:  Clear throughout to auscultation. Heart:  Regular rate and rhythm; no murmurs, rubs,  or bruits. Abdomen:  Soft, nontender and nondistended. No masses, hepatosplenomegaly or hernias noted. Normal bowel sounds. Pulses:  Normal pulses noted. Extremities:  no edema Neurologic:  alert oriented Skin:  Intact without significant lesions or rashes. Psych:  Alert and cooperative. Normal mood and affect.   Impression & Recommendations:  Problem # 1:  ANEMIA, IRON DEFICIENCY (ICD-280.9) Iron deficiency anemia with Hemoccult-positive stool most likely due to small intestinal arteriovenous malformations. No significant response to current iron dosage. No overt bleeding.  Plan #1. Iron sulfate 325 mg p.o. b.i.d. prescribed in place of Integra plus #2. Repeat CBC and ferritin 4 weeks #3. May need iron infusion and/or hematology evaluation if no response to oral iron. #4. Could consider ablation of AVMs endoscopically, but would exhaust other measures first. #5. Ongoing general medical care of Dr. Jacky Kindle  Problem # 2:  FECAL OCCULT BLOOD (ICD-792.1) see above  Patient Instructions: 1)  Prescription has been sent to your pharmacy.  2)  On 08/17/10 please go directly to the basement to have your labs drawn. The lab is open from 7:30am-5:30pm and you can come at any time.  3)  Copy sent to : Geoffry Paradise, MD 4)  The medication list was reviewed and reconciled.  All changed / newly prescribed medications were explained.  A complete medication list was provided to the patient / caregiver. Prescriptions: FERROUS SULFATE 325 (65 FE) MG TABS (FERROUS SULFATE) one tablet by mouth two times a day  #60 x 11   Entered by:   Christie Nottingham CMA (AAMA)   Authorized by:   Hilarie Fredrickson MD   Signed by:    Christie Nottingham CMA (AAMA) on 07/18/2010   Method used:   Electronically to        [facility]. [phone]* (retail)       [phone]       [place], [place]  [address]       Ph: [phone]       Fax: [phone]   RxID:   [id number]

## 2011-01-10 ENCOUNTER — Other Ambulatory Visit (HOSPITAL_COMMUNITY): Payer: Self-pay | Admitting: Internal Medicine

## 2011-01-10 DIAGNOSIS — R0602 Shortness of breath: Secondary | ICD-10-CM

## 2011-01-11 ENCOUNTER — Ambulatory Visit (HOSPITAL_COMMUNITY): Payer: Medicare Other | Attending: Internal Medicine | Admitting: Radiology

## 2011-01-11 DIAGNOSIS — I10 Essential (primary) hypertension: Secondary | ICD-10-CM | POA: Insufficient documentation

## 2011-01-11 DIAGNOSIS — R0989 Other specified symptoms and signs involving the circulatory and respiratory systems: Secondary | ICD-10-CM | POA: Insufficient documentation

## 2011-01-11 DIAGNOSIS — I059 Rheumatic mitral valve disease, unspecified: Secondary | ICD-10-CM | POA: Insufficient documentation

## 2011-01-11 DIAGNOSIS — R0602 Shortness of breath: Secondary | ICD-10-CM

## 2011-01-11 DIAGNOSIS — R0609 Other forms of dyspnea: Secondary | ICD-10-CM | POA: Insufficient documentation

## 2011-01-11 DIAGNOSIS — D649 Anemia, unspecified: Secondary | ICD-10-CM | POA: Insufficient documentation

## 2011-01-11 DIAGNOSIS — Z8673 Personal history of transient ischemic attack (TIA), and cerebral infarction without residual deficits: Secondary | ICD-10-CM | POA: Insufficient documentation

## 2011-01-11 DIAGNOSIS — Z8249 Family history of ischemic heart disease and other diseases of the circulatory system: Secondary | ICD-10-CM | POA: Insufficient documentation

## 2011-01-11 DIAGNOSIS — K31819 Angiodysplasia of stomach and duodenum without bleeding: Secondary | ICD-10-CM | POA: Insufficient documentation

## 2011-01-22 ENCOUNTER — Telehealth: Payer: Self-pay | Admitting: Cardiology

## 2011-01-22 NOTE — Telephone Encounter (Signed)
PLEASE CALL

## 2011-01-23 ENCOUNTER — Telehealth: Payer: Self-pay | Admitting: Cardiology

## 2011-01-23 NOTE — Telephone Encounter (Signed)
Returning your call. °

## 2011-01-23 NOTE — Telephone Encounter (Signed)
Scheduled office visit for next week, post echo and shortness of breath

## 2011-01-30 ENCOUNTER — Encounter: Payer: Self-pay | Admitting: *Deleted

## 2011-01-31 ENCOUNTER — Encounter: Payer: Self-pay | Admitting: Cardiology

## 2011-01-31 ENCOUNTER — Ambulatory Visit (INDEPENDENT_AMBULATORY_CARE_PROVIDER_SITE_OTHER): Payer: Medicare Other | Admitting: Cardiology

## 2011-01-31 VITALS — BP 120/70 | HR 80

## 2011-01-31 DIAGNOSIS — E7800 Pure hypercholesterolemia, unspecified: Secondary | ICD-10-CM | POA: Insufficient documentation

## 2011-01-31 DIAGNOSIS — I119 Hypertensive heart disease without heart failure: Secondary | ICD-10-CM | POA: Insufficient documentation

## 2011-01-31 DIAGNOSIS — R0600 Dyspnea, unspecified: Secondary | ICD-10-CM

## 2011-01-31 DIAGNOSIS — R0989 Other specified symptoms and signs involving the circulatory and respiratory systems: Secondary | ICD-10-CM

## 2011-01-31 DIAGNOSIS — R0609 Other forms of dyspnea: Secondary | ICD-10-CM

## 2011-01-31 DIAGNOSIS — I4949 Other premature depolarization: Secondary | ICD-10-CM

## 2011-01-31 DIAGNOSIS — I493 Ventricular premature depolarization: Secondary | ICD-10-CM

## 2011-01-31 DIAGNOSIS — I519 Heart disease, unspecified: Secondary | ICD-10-CM

## 2011-01-31 NOTE — Assessment & Plan Note (Signed)
The patient has a past history of hypertension.She has not been experiencing any Headaches or TIA symptoms.

## 2011-01-31 NOTE — Assessment & Plan Note (Signed)
The patient returns for a further followup of her symptoms of dyspnea.  We had initially seen her in April 2011 dyspnea of unknown etiology but she was subsequently found to be severely anemic and her workup was postponed until this year.  Her hemoglobin is now up to 13.  She continues to have symptoms of dyspnea.  She had a recent echocardiogram on 01/11/11 which showed an ejection fraction of 55% with grade 2 diastolic dysfunction.

## 2011-01-31 NOTE — Assessment & Plan Note (Signed)
The patient has not been experiencing any chest pain but does have what she describes as an empty feeling in the center her chest periodically.  In February she had an episode of near-syncope after normal housework.  She has not been aware of any arrhythmia but her electrocardiogram today shows occasional interpolated PVCs.

## 2011-01-31 NOTE — Progress Notes (Signed)
History of Present Illness: This pleasant 75 year old Caucasian female returns to the office for a followup visit to discuss her ongoing problems with exertional dyspnea.  We had seen her initially in April 2011 and we are preparing at that time to proceed with a cardiology workup of her symptoms but within found that her hemoglobin was only 8 and this was subsequently worked up by Dr. Jacky Kindle and Dr. Marina Goodell.  She was found to have AV malformation or small bowel.  This was found by capsule endoscopy.  The patient is now on iron therapy her hemoglobin is up to 13 and she is no longer anemic but she is still experiencing exertional dyspnea.  Her recent two-dimensional echocardiogram done 01/11/11 showed normal systolic function with grade 2 diastolic dysfunction.  The patient has a family history of coronary disease with her mother dying of a heart attack at a young age.  The patient has a history of high blood pressure and hypercholesterolemia.  Current Outpatient Prescriptions  Medication Sig Dispense Refill  . ALPRAZolam (XANAX) 0.5 MG tablet Take 0.5 tablets by mouth as needed.      Marland Kitchen aspirin 81 MG tablet Take 81 mg by mouth daily.        . celecoxib (CELEBREX) 200 MG capsule Take 200 mg by mouth as needed.        . Cholecalciferol (VITAMIN D3) 2000 UNITS TABS Take by mouth.        . diclofenac sodium (VOLTAREN) 1 % GEL Apply topically.        . Estradiol (VAGIFEM) 10 MCG TABS Place vaginally once a week.        . latanoprost (XALATAN) 0.005 % ophthalmic solution Place 1 drop into both eyes Daily.      Marland Kitchen levothyroxine (SYNTHROID, LEVOTHROID) 50 MCG tablet Take 1 tablet by mouth Daily.      . multivitamin (THERAGRAN) per tablet Take 1 tablet by mouth daily.        . Omega-3 Fatty Acids (FISH OIL) 1000 MG CAPS Take by mouth.        . traZODone (DESYREL) 100 MG tablet Take 100 mg by mouth at bedtime.        . valsartan-hydrochlorothiazide (DIOVAN-HCT) 320-25 MG per tablet Take 1 tablet by mouth daily.  1/2 daily        . DISCONTD: amLODipine (NORVASC) 10 MG tablet Take 10 mg by mouth daily.        Marland Kitchen DISCONTD: atorvastatin (LIPITOR) 20 MG tablet Take 20 mg by mouth daily. 1/2 daily       . DISCONTD: folic acid (FOLVITE) 1 MG tablet Take 1 mg by mouth daily.          Allergies  Allergen Reactions  . Penicillins   . Pneumococcal Vaccines     Patient Active Problem List  Diagnoses  . ANEMIA, IRON DEFICIENCY  . GASTRITIS  . ANGIODYSPLASIA-INTESTINE  . FECAL OCCULT BLOOD  . Dyspnea  . Asymptomatic PVCs  . Diastolic dysfunction, left ventricle  . Benign hypertensive heart disease without heart failure    History  Smoking status  . Never Smoker   Smokeless tobacco  . Not on file    History  Alcohol Use  . Yes    occasional    Family History  Problem Relation Age of Onset  . Heart attack Mother   . Lung cancer Father     Review of Systems: Constitutional: no fever chills diaphoresis or fatigue or change in weight.  Head  and neck: no hearing loss, no epistaxis, no photophobia or visual disturbance. Respiratory: No cough, shortness of breath or wheezing. Cardiovascular: No chest pain peripheral edema, palpitations. Gastrointestinal: No abdominal distention, no abdominal pain, no change in bowel habits hematochezia or melena. Genitourinary: No dysuria, no frequency, no urgency, no nocturia. Musculoskeletal:No arthralgias, no back pain, no gait disturbance or myalgias. Neurological: No dizziness, no headaches, no numbness, no seizures, no syncope, no weakness, no tremors. Hematologic: No lymphadenopathy, no easy bruising. Psychiatric: No confusion, no hallucinations, no sleep disturbance.    Physical Exam: Filed Vitals:   01/31/11 1110  BP: 120/70  Pulse: 80  The general appearance feels a well-developed well-nourished woman in no distress.Pupils equal and reactive.   Extraocular Movements are full.  There is no scleral icterus.  The mouth and pharynx are normal.   The neck is supple.  The carotids reveal no bruits.  The jugular venous pressure is normal.  The thyroid is not enlarged.  There is no lymphadenopathy.The chest is clear to percussion and auscultation. There are no rales or rhonchi. Expansion of the chest is symmetrical.The precordium is quiet.  The first heart sound is normal.  The second heart sound is physiologically split.  There is no murmur gallop rub or click.  There is no abnormal lift or heave.The abdomen is soft and nontender. Bowel sounds are normal. The liver and spleen are not enlarged. There Are no abdominal masses. There are no bruits.  Normal extremity without phlebitis or edema.Strength is normal and symmetrical in all extremities.  There is no lateralizing weakness.  There are no sensory deficits.  Her electrocardiogram today shows sinus bradycardia with an interpolated PVC.  There are no ischemic changes at rest.   Assessment / Plan: We will have the patient return soon for a treadmill Cardiolite stress test to evaluate her symptoms of dyspnea and her sensation of "empty feeling in the center of Her chest ".

## 2011-02-11 ENCOUNTER — Encounter (HOSPITAL_COMMUNITY): Payer: BC Managed Care – PPO | Admitting: Radiology

## 2011-02-12 ENCOUNTER — Ambulatory Visit (HOSPITAL_COMMUNITY): Payer: Medicare Other | Attending: Cardiology | Admitting: Radiology

## 2011-02-12 ENCOUNTER — Encounter: Payer: Self-pay | Admitting: Cardiology

## 2011-02-12 VITALS — Ht 62.0 in | Wt 127.0 lb

## 2011-02-12 DIAGNOSIS — R0989 Other specified symptoms and signs involving the circulatory and respiratory systems: Secondary | ICD-10-CM | POA: Insufficient documentation

## 2011-02-12 DIAGNOSIS — R0609 Other forms of dyspnea: Secondary | ICD-10-CM | POA: Insufficient documentation

## 2011-02-12 DIAGNOSIS — R0600 Dyspnea, unspecified: Secondary | ICD-10-CM

## 2011-02-12 DIAGNOSIS — I4949 Other premature depolarization: Secondary | ICD-10-CM

## 2011-02-12 DIAGNOSIS — R079 Chest pain, unspecified: Secondary | ICD-10-CM

## 2011-02-12 MED ORDER — REGADENOSON 0.4 MG/5ML IV SOLN: 0.4000 mg | Freq: Once | INTRAVENOUS | Status: DC

## 2011-02-12 MED ORDER — TECHNETIUM TC 99M TETROFOSMIN IV KIT
10.3000 | PACK | Freq: Once | INTRAVENOUS | Status: AC | PRN
  Administered 2011-02-12: 10 via INTRAVENOUS

## 2011-02-12 MED ORDER — TECHNETIUM TC 99M TETROFOSMIN IV KIT
33.0000 | PACK | Freq: Once | INTRAVENOUS | Status: AC | PRN
  Administered 2011-02-12: 33 via INTRAVENOUS

## 2011-02-12 NOTE — Progress Notes (Signed)
[facility] SITE 3 NUCLEAR MED [address] [place] [address] [phone]  Cardiology Nuclear Med Study  Monique Wright is a 74 y.o. female [id number] [birth year]   Nuclear Med Background Indication for Stress Test:  Evaluation for Ischemia History: 01/11/11 Echo55%diastolic dysunction and 03 Myocardial Perfusion Study NL EF=71% Cardiac Risk Factors: Family History - CAD, Hypertension, Lipids and TIA (o6)  Symptoms:  Dizziness, DOE, Fatigue, Light-Headedness, SOB and "empty feeling in chest"   Nuclear Pre-Procedure Caffeine/Decaff Intake:  None NPO After: 7:00pm   Lungs:  clear IV 0.9% NS with Angio Cath:  22g  IV Site: R Hand  IV Started by:  Cathlyn Parsons, RN  Chest Size (in):  32 Cup Size: C  Height: 5\' 2"  (1.575 m)  Weight:  127 lb (57.607 kg)  BMI:  Body mass index is 23.23 kg/(m^2). Tech Comments: n/a    Nuclear Med Study 1 or 2 day study: 1 day  Stress Test Type:  Stress  Reading MD: Arvilla Meres, MD  Order Authorizing Provider:  T.Brockbill  Resting Radionuclide: Technetium 25m Tetrofosmin Resting Radionuclide Dose: 10.3 mCi   Stress Radionuclide:  Technetium 24m Tetrofosmin  Stress Radionuclide Dose: 33 mCi           Stress Protocol Rest HR: 64 Stress HR: 137  Rest BP: 126/78 Stress BP: 172/64  Exercise Time (min): 7:01 mins METS: 8.50          Dose of Adenosine (mg):  n/a Dose of Lexiscan: n/a mg  Dose of Atropine (mg): n/a Dose of Dobutamine: n/a mcg/kg/min (at max HR)  Stress Test Technologist: Frederick Peers, EMT-P  Nuclear Technologist:  Domenic Polite, CNMT     Rest Procedure:  Myocardial perfusion imaging was performed at rest 45 minutes following the intravenous administration of Technetium 25m Tetrofosmin. Rest ECG: SR with PVCs  Stress Procedure:  The patient exercised for 7"84mins.  The patient stopped due to SOB and denied any chest pain.  There were non specific ST-T wave changes/freq PVCs/Bigemeny.   Technetium 64m Tetrofosmin was injected at peak exercise and myocardial perfusion imaging was performed after a brief delay. Stress ECG: Insignificant upsloping ST segment depression.  QPS Raw Data Images:  Normal; no motion artifact; normal heart/lung ratio. Stress Images:  Normal homogeneous uptake in all areas of the myocardium. Rest Images:  Normal homogeneous uptake in all areas of the myocardium. Subtraction (SDS):  No evidence of ischemia. Transient Ischemic Dilatation (Normal <1.22):  1.02 Lung/Heart Ratio (Normal <0.45):  .30   Quantitative Gated Spect Images QGS EDV:  47 ml QGS ESV:  10 ml QGS cine images:  NL LV Function; NL Wall Motion QGS EF: 78%  Impression Exercise Capacity:  Fair exercise capacity. BP Response:  Normal blood pressure response. Clinical Symptoms:  There is dyspnea. ECG Impression:  No significant ST segment change suggestive of ischemia. Scattered PVCs. Comparison with Prior Nuclear Study: No images to compare  Overall Impression:  Normal stress nuclear study.       Daniel Bensimhon

## 2011-02-13 NOTE — Progress Notes (Addendum)
Routed to Dr.Brackbill.Mirna Mires Please report.  The stress test was normal. Cassell Clement

## 2011-02-22 NOTE — Progress Notes (Signed)
Advised patient

## 2011-02-22 NOTE — Progress Notes (Signed)
Patient returned call, had been out of town.  Advised of test results.

## 2011-11-18 DIAGNOSIS — Z1231 Encounter for screening mammogram for malignant neoplasm of breast: Secondary | ICD-10-CM | POA: Diagnosis not present

## 2011-11-22 DIAGNOSIS — M7750 Other enthesopathy of unspecified foot: Secondary | ICD-10-CM | POA: Diagnosis not present

## 2011-11-22 DIAGNOSIS — M7710 Lateral epicondylitis, unspecified elbow: Secondary | ICD-10-CM | POA: Diagnosis not present

## 2011-12-20 DIAGNOSIS — M7710 Lateral epicondylitis, unspecified elbow: Secondary | ICD-10-CM | POA: Diagnosis not present

## 2011-12-30 DIAGNOSIS — E039 Hypothyroidism, unspecified: Secondary | ICD-10-CM | POA: Diagnosis not present

## 2011-12-30 DIAGNOSIS — D649 Anemia, unspecified: Secondary | ICD-10-CM | POA: Diagnosis not present

## 2011-12-30 DIAGNOSIS — I10 Essential (primary) hypertension: Secondary | ICD-10-CM | POA: Diagnosis not present

## 2011-12-30 DIAGNOSIS — M949 Disorder of cartilage, unspecified: Secondary | ICD-10-CM | POA: Diagnosis not present

## 2011-12-30 DIAGNOSIS — M899 Disorder of bone, unspecified: Secondary | ICD-10-CM | POA: Diagnosis not present

## 2011-12-30 DIAGNOSIS — E785 Hyperlipidemia, unspecified: Secondary | ICD-10-CM | POA: Diagnosis not present

## 2012-01-06 DIAGNOSIS — Z0000 Encounter for general adult medical examination without abnormal findings: Secondary | ICD-10-CM | POA: Diagnosis not present

## 2012-01-06 DIAGNOSIS — I10 Essential (primary) hypertension: Secondary | ICD-10-CM | POA: Diagnosis not present

## 2012-01-06 DIAGNOSIS — E785 Hyperlipidemia, unspecified: Secondary | ICD-10-CM | POA: Diagnosis not present

## 2012-01-06 DIAGNOSIS — E039 Hypothyroidism, unspecified: Secondary | ICD-10-CM | POA: Diagnosis not present

## 2012-03-06 DIAGNOSIS — Q150 Congenital glaucoma: Secondary | ICD-10-CM | POA: Diagnosis not present

## 2012-03-06 DIAGNOSIS — H259 Unspecified age-related cataract: Secondary | ICD-10-CM | POA: Diagnosis not present

## 2012-03-06 DIAGNOSIS — H4011X0 Primary open-angle glaucoma, stage unspecified: Secondary | ICD-10-CM | POA: Diagnosis not present

## 2012-03-06 DIAGNOSIS — H409 Unspecified glaucoma: Secondary | ICD-10-CM | POA: Diagnosis not present

## 2012-06-10 DIAGNOSIS — Z124 Encounter for screening for malignant neoplasm of cervix: Secondary | ICD-10-CM | POA: Diagnosis not present

## 2012-06-10 DIAGNOSIS — B373 Candidiasis of vulva and vagina: Secondary | ICD-10-CM | POA: Diagnosis not present

## 2012-06-10 DIAGNOSIS — N390 Urinary tract infection, site not specified: Secondary | ICD-10-CM | POA: Diagnosis not present

## 2012-06-19 DIAGNOSIS — H1130 Conjunctival hemorrhage, unspecified eye: Secondary | ICD-10-CM | POA: Diagnosis not present

## 2012-07-29 DIAGNOSIS — H259 Unspecified age-related cataract: Secondary | ICD-10-CM | POA: Diagnosis not present

## 2012-07-29 DIAGNOSIS — H4011X0 Primary open-angle glaucoma, stage unspecified: Secondary | ICD-10-CM | POA: Diagnosis not present

## 2012-07-29 DIAGNOSIS — H409 Unspecified glaucoma: Secondary | ICD-10-CM | POA: Diagnosis not present

## 2012-07-29 DIAGNOSIS — H1130 Conjunctival hemorrhage, unspecified eye: Secondary | ICD-10-CM | POA: Diagnosis not present

## 2012-10-27 DIAGNOSIS — H409 Unspecified glaucoma: Secondary | ICD-10-CM | POA: Diagnosis not present

## 2012-10-27 DIAGNOSIS — H4011X0 Primary open-angle glaucoma, stage unspecified: Secondary | ICD-10-CM | POA: Diagnosis not present

## 2012-11-12 DIAGNOSIS — H4011X0 Primary open-angle glaucoma, stage unspecified: Secondary | ICD-10-CM | POA: Diagnosis not present

## 2012-11-12 DIAGNOSIS — H259 Unspecified age-related cataract: Secondary | ICD-10-CM | POA: Diagnosis not present

## 2012-11-12 DIAGNOSIS — H409 Unspecified glaucoma: Secondary | ICD-10-CM | POA: Diagnosis not present

## 2012-11-12 DIAGNOSIS — H5340 Unspecified visual field defects: Secondary | ICD-10-CM | POA: Diagnosis not present

## 2012-11-23 DIAGNOSIS — Z1231 Encounter for screening mammogram for malignant neoplasm of breast: Secondary | ICD-10-CM | POA: Diagnosis not present

## 2012-12-14 DIAGNOSIS — K219 Gastro-esophageal reflux disease without esophagitis: Secondary | ICD-10-CM | POA: Diagnosis not present

## 2012-12-14 DIAGNOSIS — R05 Cough: Secondary | ICD-10-CM | POA: Diagnosis not present

## 2012-12-14 DIAGNOSIS — R1310 Dysphagia, unspecified: Secondary | ICD-10-CM | POA: Diagnosis not present

## 2012-12-14 DIAGNOSIS — R490 Dysphonia: Secondary | ICD-10-CM | POA: Diagnosis not present

## 2013-01-06 DIAGNOSIS — I10 Essential (primary) hypertension: Secondary | ICD-10-CM | POA: Diagnosis not present

## 2013-01-06 DIAGNOSIS — R82998 Other abnormal findings in urine: Secondary | ICD-10-CM | POA: Diagnosis not present

## 2013-01-06 DIAGNOSIS — E039 Hypothyroidism, unspecified: Secondary | ICD-10-CM | POA: Diagnosis not present

## 2013-01-06 DIAGNOSIS — E785 Hyperlipidemia, unspecified: Secondary | ICD-10-CM | POA: Diagnosis not present

## 2013-01-06 DIAGNOSIS — M899 Disorder of bone, unspecified: Secondary | ICD-10-CM | POA: Diagnosis not present

## 2013-01-06 DIAGNOSIS — M949 Disorder of cartilage, unspecified: Secondary | ICD-10-CM | POA: Diagnosis not present

## 2013-01-14 DIAGNOSIS — Z1331 Encounter for screening for depression: Secondary | ICD-10-CM | POA: Diagnosis not present

## 2013-01-14 DIAGNOSIS — D649 Anemia, unspecified: Secondary | ICD-10-CM | POA: Diagnosis not present

## 2013-01-14 DIAGNOSIS — Z0000 Encounter for general adult medical examination without abnormal findings: Secondary | ICD-10-CM | POA: Diagnosis not present

## 2013-01-14 DIAGNOSIS — E039 Hypothyroidism, unspecified: Secondary | ICD-10-CM | POA: Diagnosis not present

## 2013-01-14 DIAGNOSIS — E785 Hyperlipidemia, unspecified: Secondary | ICD-10-CM | POA: Diagnosis not present

## 2013-01-14 DIAGNOSIS — M4120 Other idiopathic scoliosis, site unspecified: Secondary | ICD-10-CM | POA: Diagnosis not present

## 2013-01-14 DIAGNOSIS — I10 Essential (primary) hypertension: Secondary | ICD-10-CM | POA: Diagnosis not present

## 2013-01-14 DIAGNOSIS — M899 Disorder of bone, unspecified: Secondary | ICD-10-CM | POA: Diagnosis not present

## 2013-01-14 DIAGNOSIS — M949 Disorder of cartilage, unspecified: Secondary | ICD-10-CM | POA: Diagnosis not present

## 2013-01-25 DIAGNOSIS — M949 Disorder of cartilage, unspecified: Secondary | ICD-10-CM | POA: Diagnosis not present

## 2013-01-25 DIAGNOSIS — Z13820 Encounter for screening for osteoporosis: Secondary | ICD-10-CM | POA: Diagnosis not present

## 2013-01-25 DIAGNOSIS — M899 Disorder of bone, unspecified: Secondary | ICD-10-CM | POA: Diagnosis not present

## 2013-01-26 DIAGNOSIS — H6980 Other specified disorders of Eustachian tube, unspecified ear: Secondary | ICD-10-CM | POA: Diagnosis not present

## 2013-01-26 DIAGNOSIS — H6120 Impacted cerumen, unspecified ear: Secondary | ICD-10-CM | POA: Diagnosis not present

## 2013-01-26 DIAGNOSIS — H6520 Chronic serous otitis media, unspecified ear: Secondary | ICD-10-CM | POA: Diagnosis not present

## 2013-01-26 DIAGNOSIS — R42 Dizziness and giddiness: Secondary | ICD-10-CM | POA: Diagnosis not present

## 2013-02-01 DIAGNOSIS — E039 Hypothyroidism, unspecified: Secondary | ICD-10-CM | POA: Diagnosis not present

## 2013-02-01 DIAGNOSIS — I10 Essential (primary) hypertension: Secondary | ICD-10-CM | POA: Diagnosis not present

## 2013-02-01 DIAGNOSIS — R110 Nausea: Secondary | ICD-10-CM | POA: Diagnosis not present

## 2013-02-01 DIAGNOSIS — R42 Dizziness and giddiness: Secondary | ICD-10-CM | POA: Diagnosis not present

## 2013-04-19 DIAGNOSIS — L821 Other seborrheic keratosis: Secondary | ICD-10-CM | POA: Diagnosis not present

## 2013-05-14 DIAGNOSIS — H409 Unspecified glaucoma: Secondary | ICD-10-CM | POA: Diagnosis not present

## 2013-05-14 DIAGNOSIS — H47239 Glaucomatous optic atrophy, unspecified eye: Secondary | ICD-10-CM | POA: Diagnosis not present

## 2013-05-14 DIAGNOSIS — H401290 Low-tension glaucoma, unspecified eye, stage unspecified: Secondary | ICD-10-CM | POA: Diagnosis not present

## 2013-05-14 DIAGNOSIS — H259 Unspecified age-related cataract: Secondary | ICD-10-CM | POA: Diagnosis not present

## 2013-06-22 DIAGNOSIS — Z124 Encounter for screening for malignant neoplasm of cervix: Secondary | ICD-10-CM | POA: Diagnosis not present

## 2013-06-22 DIAGNOSIS — Z130 Encounter for screening for diseases of the blood and blood-forming organs and certain disorders involving the immune mechanism: Secondary | ICD-10-CM | POA: Diagnosis not present

## 2013-10-11 DIAGNOSIS — H409 Unspecified glaucoma: Secondary | ICD-10-CM | POA: Diagnosis not present

## 2013-10-11 DIAGNOSIS — H04129 Dry eye syndrome of unspecified lacrimal gland: Secondary | ICD-10-CM | POA: Diagnosis not present

## 2013-10-11 DIAGNOSIS — H401290 Low-tension glaucoma, unspecified eye, stage unspecified: Secondary | ICD-10-CM | POA: Diagnosis not present

## 2013-10-11 DIAGNOSIS — H43819 Vitreous degeneration, unspecified eye: Secondary | ICD-10-CM | POA: Diagnosis not present

## 2013-11-24 DIAGNOSIS — D649 Anemia, unspecified: Secondary | ICD-10-CM | POA: Diagnosis not present

## 2013-11-24 DIAGNOSIS — Z1231 Encounter for screening mammogram for malignant neoplasm of breast: Secondary | ICD-10-CM | POA: Diagnosis not present

## 2013-11-25 ENCOUNTER — Encounter: Payer: Self-pay | Admitting: Internal Medicine

## 2013-11-25 DIAGNOSIS — R0602 Shortness of breath: Secondary | ICD-10-CM | POA: Diagnosis not present

## 2013-11-25 DIAGNOSIS — Q265 Anomalous portal venous connection: Secondary | ICD-10-CM | POA: Insufficient documentation

## 2013-11-25 DIAGNOSIS — Q2733 Arteriovenous malformation of digestive system vessel: Secondary | ICD-10-CM | POA: Diagnosis not present

## 2013-11-25 DIAGNOSIS — IMO0002 Reserved for concepts with insufficient information to code with codable children: Secondary | ICD-10-CM | POA: Diagnosis not present

## 2013-11-25 DIAGNOSIS — D649 Anemia, unspecified: Secondary | ICD-10-CM | POA: Diagnosis not present

## 2013-11-25 DIAGNOSIS — I10 Essential (primary) hypertension: Secondary | ICD-10-CM | POA: Diagnosis not present

## 2013-11-30 DIAGNOSIS — I10 Essential (primary) hypertension: Secondary | ICD-10-CM | POA: Diagnosis not present

## 2013-11-30 DIAGNOSIS — D649 Anemia, unspecified: Secondary | ICD-10-CM | POA: Diagnosis not present

## 2013-11-30 DIAGNOSIS — IMO0002 Reserved for concepts with insufficient information to code with codable children: Secondary | ICD-10-CM | POA: Diagnosis not present

## 2013-12-01 DIAGNOSIS — Z1212 Encounter for screening for malignant neoplasm of rectum: Secondary | ICD-10-CM | POA: Diagnosis not present

## 2013-12-03 ENCOUNTER — Ambulatory Visit (INDEPENDENT_AMBULATORY_CARE_PROVIDER_SITE_OTHER): Payer: Medicare Other | Admitting: Physician Assistant

## 2013-12-03 ENCOUNTER — Other Ambulatory Visit: Payer: Self-pay | Admitting: *Deleted

## 2013-12-03 ENCOUNTER — Encounter: Payer: Self-pay | Admitting: Physician Assistant

## 2013-12-03 VITALS — BP 102/70 | HR 68 | Ht 62.0 in | Wt 131.0 lb

## 2013-12-03 DIAGNOSIS — Z8774 Personal history of (corrected) congenital malformations of heart and circulatory system: Secondary | ICD-10-CM

## 2013-12-03 DIAGNOSIS — R195 Other fecal abnormalities: Secondary | ICD-10-CM

## 2013-12-03 DIAGNOSIS — D509 Iron deficiency anemia, unspecified: Secondary | ICD-10-CM

## 2013-12-03 DIAGNOSIS — D500 Iron deficiency anemia secondary to blood loss (chronic): Secondary | ICD-10-CM

## 2013-12-03 DIAGNOSIS — Z8679 Personal history of other diseases of the circulatory system: Secondary | ICD-10-CM | POA: Diagnosis not present

## 2013-12-03 NOTE — Progress Notes (Signed)
Subjective:    Patient ID: Monique Wright, female    DOB: 12-21-1935, 78 y.o.   MRN: [id number]  [place] is a pleasant 78 year old white female known to Dr. [name] referred by Dr. Reynaldo Minium today for evaluation of recurrent iron deficiency anemia, and Hemoccult-positive stool. Patient had undergone workup for the same in the fall of 2011. She had upper endoscopy done at that time showing very mild esophagitis. Colonoscopy showed a diminutive polyp in the rectum and otherwise was unremarkable. Capsule endoscopy was also done and it shows several tiny AVMs and one larger AVM felt to be in the proximal small bowel. At that time chronic iron therapy was recommended and consider enteroscopy and ablation of the larger AVM if she developed significant anemia. Patient states that she has remained on ferrous sulfate one daily over the past few years and had done well with a stable hemoglobin until about the past 2 months. She says she noticed around Christmas time that she seemed to be getting fatigue and was more short of breath with exertion. She has not noted any melena or hematochezia has no complaints of abdominal discomfort changes in her bowel habits dysphagia odynophagia or abdominal pain.  She did not have her hemoglobin checked until February and this was low at 8.3 with hematocrit of 26 MCV of 68 WBC 4.8 and platelets 288. Iron of 29D iron saturation of 74 and TIBC of 102 ferritin was low at 18.8. She had repeat hemoglobin on 11/30/2013 which was 8.9. She was asked to stop her baby aspirin and increase her ferrous sulfate to 3 times daily. She says she feels about the same is still fatigued and short of breath with exertion.    Review of Systems  Constitutional: Positive for activity change and fatigue.  HENT: Negative.   Eyes: Negative.   Respiratory: Positive for shortness of breath.   Cardiovascular: Negative.   Gastrointestinal: Negative.   Endocrine: Negative.   Genitourinary:  Negative.   Musculoskeletal: Negative.   Skin: Negative.   Allergic/Immunologic: Negative.   Neurological: Negative.   Hematological: Negative.   Psychiatric/Behavioral: Negative.    Allergies  Allergen Reactions  . Penicillins   . Pneumococcal Vaccines        Patient Active Problem List   Diagnosis Date Noted  . Dyspnea 01/31/2011  . Asymptomatic PVCs 01/31/2011  . Diastolic dysfunction, left ventricle 01/31/2011  . Benign hypertensive heart disease without heart failure 01/31/2011  . Hypercholesterolemia 01/31/2011  . ANGIODYSPLASIA-INTESTINE 08/22/2010  . GASTRITIS 05/07/2010  . ANEMIA, IRON DEFICIENCY 04/16/2010  . FECAL OCCULT BLOOD 04/16/2010   History  Substance Use Topics  . Smoking status: Never Smoker   . Smokeless tobacco: Never Used  . Alcohol Use: Yes     Comment: occasional   family history includes Heart attack in her mother; Lung cancer in her father.  Objective:   Physical Exam  well-developed older white female in no acute distress, pleasant blood pressure 102/70 pulse 68 height 5 foot 2 weight 131. HEENT; nontraumatic normocephalic EOMI PERRLA sclera anicteric, Supple; no JVD, Cardiovascular; regular rate and rhythm with S1-S2 no murmur or gallop, Pulmonary; clear bilaterally, Abdomen; soft nontender nondistended bowel sounds are active there is no palpable mass or hepatosplenomegaly, Rectal ;exam not done, Extremities; no clubbing cyanosis or edema skin warm dry, Psych; mood and affect normal and appropriate        Assessment & Plan:  #17 78 year old female with significant recurrent microcytic anemia and Hemoccult-positive stool. Patient symptomatic  with fatigue and exertional dyspnea. Hemoglobin is stable over the past week or so. Suspect she has been oozing from small bowel AVMs noted on previous capsule endoscopy  #2 history of PVCs #3 hyperlipidemia  Plan; continue ferrous sulfate 3 times daily x1 more month and then decrease to once  daily Repeat hemoglobin to 2-3 weeks until normalized-she is having her labs drawn in Dr. Jacquiline Doe office Patient is advised to call should she develop any significant changes in shortness of breath weakness fatigue etc. and she may need blood transfusion Will schedule for upper endoscopy with enteroscopy and possible ablation of AVMs with Dr. [name]. Procedure was discussed in detail with the patient and she is agreeable to proceed

## 2013-12-03 NOTE — Progress Notes (Signed)
Agree with assessment and plans. NEEDS SCHEDULED DURING NEXT HOSPITAL WEEK AND NEEDS TO BE WITH MAC / PROPOFOL.

## 2013-12-03 NOTE — Patient Instructions (Signed)
You have been scheduled for an endoscopy with propofol. Please follow written instructions given to you at your visit today.  Location: [facility] Go to [facility] front entrance and go to patient registration. Stay on Ferrous Sulfate 3 times daily for 1 month then go to once daily.

## 2013-12-10 ENCOUNTER — Encounter (HOSPITAL_COMMUNITY): Payer: Self-pay | Admitting: Pharmacy Technician

## 2013-12-13 ENCOUNTER — Encounter (HOSPITAL_COMMUNITY): Payer: Self-pay | Admitting: *Deleted

## 2013-12-13 ENCOUNTER — Telehealth: Payer: Self-pay | Admitting: Physician Assistant

## 2013-12-13 NOTE — Telephone Encounter (Signed)
Spoke with patient and gave her Amy Esterwood,PA explanation.

## 2013-12-13 NOTE — Telephone Encounter (Signed)
Patient is asking why she needs to have an EGD when back in 2011, the EGD did not show anything. She ended up having a SBCE which did show AVM's. She wonders why not do SBCE. Please,  Advise.

## 2013-12-13 NOTE — Telephone Encounter (Signed)
Pt is set up for EGD and enteroscopy which evaluates her small bowel where we found  AVM's previously   This procedure will allow [place] actually to treat them  To get them to stop oozing

## 2013-12-21 ENCOUNTER — Encounter (HOSPITAL_COMMUNITY): Payer: Self-pay | Admitting: *Deleted

## 2013-12-21 ENCOUNTER — Ambulatory Visit (HOSPITAL_COMMUNITY): Payer: Medicare Other | Admitting: Certified Registered Nurse Anesthetist

## 2013-12-21 ENCOUNTER — Ambulatory Visit (HOSPITAL_COMMUNITY)
Admission: RE | Admit: 2013-12-21 | Discharge: 2013-12-21 | Disposition: A | Discharge status: Home / Self Care | Payer: Medicare Other | Source: Ambulatory Visit | Attending: Internal Medicine | Admitting: Internal Medicine

## 2013-12-21 ENCOUNTER — Encounter (HOSPITAL_COMMUNITY): Payer: Medicare Other | Admitting: Certified Registered Nurse Anesthetist

## 2013-12-21 ENCOUNTER — Encounter (HOSPITAL_COMMUNITY)
Admission: RE | Disposition: A | Discharge status: Home / Self Care | Payer: Self-pay | Source: Ambulatory Visit | Attending: Internal Medicine

## 2013-12-21 DIAGNOSIS — K31811 Angiodysplasia of stomach and duodenum with bleeding: Secondary | ICD-10-CM | POA: Diagnosis not present

## 2013-12-21 DIAGNOSIS — I2510 Atherosclerotic heart disease of native coronary artery without angina pectoris: Secondary | ICD-10-CM | POA: Insufficient documentation

## 2013-12-21 DIAGNOSIS — K5520 Angiodysplasia of colon without hemorrhage: Secondary | ICD-10-CM | POA: Diagnosis not present

## 2013-12-21 DIAGNOSIS — Z79899 Other long term (current) drug therapy: Secondary | ICD-10-CM | POA: Insufficient documentation

## 2013-12-21 DIAGNOSIS — D509 Iron deficiency anemia, unspecified: Secondary | ICD-10-CM

## 2013-12-21 DIAGNOSIS — I10 Essential (primary) hypertension: Secondary | ICD-10-CM | POA: Insufficient documentation

## 2013-12-21 DIAGNOSIS — K5521 Angiodysplasia of colon with hemorrhage: Secondary | ICD-10-CM | POA: Insufficient documentation

## 2013-12-21 DIAGNOSIS — E785 Hyperlipidemia, unspecified: Secondary | ICD-10-CM | POA: Diagnosis not present

## 2013-12-21 DIAGNOSIS — R195 Other fecal abnormalities: Secondary | ICD-10-CM | POA: Diagnosis not present

## 2013-12-21 HISTORY — DX: Unspecified osteoarthritis, unspecified site: M19.90

## 2013-12-21 HISTORY — DX: Hypothyroidism, unspecified: E03.9

## 2013-12-21 HISTORY — PX: ENTEROSCOPY: SHX5533

## 2013-12-21 HISTORY — DX: Essential (primary) hypertension: I10

## 2013-12-21 HISTORY — DX: Anemia, unspecified: D64.9

## 2013-12-21 SURGERY — ENTEROSCOPY
Anesthesia: Monitor Anesthesia Care

## 2013-12-21 MED ORDER — SODIUM CHLORIDE 0.9 % IV SOLN: INTRAVENOUS | Status: DC

## 2013-12-21 MED ORDER — PROPOFOL 10 MG/ML IV BOLUS
INTRAVENOUS | Status: DC | PRN
  Administered 2013-12-21 (×2): 20 mg via INTRAVENOUS

## 2013-12-21 MED ORDER — LACTATED RINGERS IV SOLN
INTRAVENOUS | Status: DC
  Administered 2013-12-21: 11:00:00 via INTRAVENOUS
  Administered 2013-12-21: 1000 mL via INTRAVENOUS

## 2013-12-21 MED ORDER — PROPOFOL INFUSION 10 MG/ML OPTIME
INTRAVENOUS | Status: DC | PRN
  Administered 2013-12-21: 50 ug/kg/min via INTRAVENOUS

## 2013-12-21 MED ORDER — BUTAMBEN-TETRACAINE-BENZOCAINE 2-2-14 % EX AERO
INHALATION_SPRAY | CUTANEOUS | Status: DC | PRN
  Administered 2013-12-21: 2 via TOPICAL

## 2013-12-21 MED ORDER — LIDOCAINE HCL (CARDIAC) 20 MG/ML IV SOLN
INTRAVENOUS | Status: DC | PRN
  Administered 2013-12-21: 40 mg via INTRAVENOUS

## 2013-12-21 MED ORDER — PROPOFOL 10 MG/ML IV BOLUS
INTRAVENOUS | Status: AC
  Filled 2013-12-21: qty 20

## 2013-12-21 MED ORDER — ONDANSETRON HCL 4 MG/2ML IJ SOLN
INTRAMUSCULAR | Status: DC | PRN
  Administered 2013-12-21: 4 mg via INTRAVENOUS

## 2013-12-21 NOTE — Preoperative (Signed)
Beta Blockers   Reason not to administer Beta Blockers:Not Applicable 

## 2013-12-21 NOTE — Interval H&P Note (Signed)
History and Physical Interval Note:  No interval change from recent office visit. For EGD / enteroscopy to evaluate heme + / Iron deficiency anemia.  12/21/2013 9:50 AM  Monique Wright  has presented today for surgery, with the diagnosis of Iron deficiency anemia 280.9 Hem + Stool 792.1  The various methods of treatment have been discussed with the patient and family. After consideration of risks, benefits and other options for treatment, the patient has consented to  Procedure(s): ENTEROSCOPY (N/A) as a surgical intervention .  The patient's history has been reviewed, patient examined, no change in status, stable for surgery.  I have reviewed the patient's chart and labs.  Questions were answered to the patient's satisfaction.     [name]

## 2013-12-21 NOTE — Transfer of Care (Signed)
Immediate Anesthesia Transfer of Care Note  Patient: Monique Wright  Procedure(s) Performed: Procedure(s) (LRB): ENTEROSCOPY (N/A)  Patient Location: PACU  Anesthesia Type: MAC  Level of Consciousness: sedated, patient cooperative and responds to stimulation  Airway & Oxygen Therapy: Patient Spontanous Breathing and Patient connected to face mask oxgen  Post-op Assessment: Report given to PACU RN and Post -op Vital signs reviewed and stable  Post vital signs: Reviewed and stable  Complications: No apparent anesthesia complications

## 2013-12-21 NOTE — Op Note (Signed)
[facility] [place], [address]   ENDOSCOPY PROCEDURE REPORT  PATIENT: Monique Wright, Monique Wright  MR#: [id number] BIRTHDATE: June 27, 1936 , 40  yrs. old GENDER: Female ENDOSCOPIST: Eustace Quail, MD REFERRED BY:  Burnard Bunting, M.D. PROCEDURE DATE:  12/21/2013 PROCEDURE:  EGD and small bowel enteroscopy w/ control of bleeding (APC therapy) ASA CLASS:     Class II INDICATIONS:  Iron deficiency anemia.   Heme positive stool. MEDICATIONS: MAC sedation, administered by CRNA and See Anesthesia Report. TOPICAL ANESTHETIC: none  DESCRIPTION OF PROCEDURE: After the risks benefits and alternatives of the procedure were thoroughly explained, informed consent was obtained.  The [id number] ([id number]) dedicated small bowel enteroscope was introduced through the mouth and advanced to the mid jejunum. Without limitations.  The instrument was slowly withdrawn as the mucosa was fully examined.      The esophagus was normal.  The stomach was normal.  The duodenal bulb normal.  Post bulbar duodenum revealed several tiny nonbleeding AVMs.  In the proximal jejunum a moderate-sized oozing AVM was identified and treated with APC, successfully.  Smaller identified AVMs were ablated as well.  Retroflexed views revealed no abnormalities.     The scope was then withdrawn from the patient and the procedure completed.  COMPLICATIONS: There were no complications. ENDOSCOPIC IMPRESSION: 1. Small bowel AVMs as the cause for iron deficiency anemia and Hemoccult-positive stool. Status post APC therapy.  RECOMMENDATIONS: 1. Continue iron therapy 3 times daily indefinitely 2. Repeat CBC as an outpatient and monitor thereafter as clinically indicated per Dr. Reynaldo Minium 3. GI followup as needed  REPEAT EXAM:  eSigned:  Eustace Quail, MD 12/21/2013 11:31 AM   MV:HQIONGE Reynaldo Minium, MD and The Patient

## 2013-12-21 NOTE — Anesthesia Postprocedure Evaluation (Signed)
  Anesthesia Post-op Note  Patient: Monique Wright  Procedure(s) Performed: Procedure(s) (LRB): ENTEROSCOPY (N/A)  Patient Location: PACU  Anesthesia Type: MAC  Level of Consciousness: awake and alert   Airway and Oxygen Therapy: Patient Spontanous Breathing  Post-op Pain: mild  Post-op Assessment: Post-op Vital signs reviewed, Patient's Cardiovascular Status Stable, Respiratory Function Stable, Patent Airway and No signs of Nausea or vomiting  Last Vitals:  Filed Vitals:   12/21/13 1142  BP: 135/81  Pulse:   Temp:   Resp: 15    Post-op Vital Signs: stable   Complications: No apparent anesthesia complications

## 2013-12-21 NOTE — H&P (View-Only) (Signed)
Agree with assessment and plans. NEEDS SCHEDULED DURING NEXT HOSPITAL WEEK AND NEEDS TO BE WITH MAC / PROPOFOL. 

## 2013-12-21 NOTE — Anesthesia Preprocedure Evaluation (Addendum)
Anesthesia Evaluation  Patient identified by MRN, date of birth, ID band Patient awake    Reviewed: Allergy & Precautions, H&P , NPO status , Patient's Chart, lab work & pertinent test results  Airway Mallampati: II TM Distance: >3 FB Neck ROM: Full    Dental no notable dental hx.    Pulmonary neg pulmonary ROS,  breath sounds clear to auscultation  Pulmonary exam normal       Cardiovascular hypertension, - CAD Rhythm:Regular Rate:Normal     Neuro/Psych negative neurological ROS  negative psych ROS   GI/Hepatic negative GI ROS, Neg liver ROS,   Endo/Other  negative endocrine ROS  Renal/GU negative Renal ROS  negative genitourinary   Musculoskeletal negative musculoskeletal ROS (+)   Abdominal   Peds negative pediatric ROS (+)  Hematology negative hematology ROS (+)   Anesthesia Other Findings   Reproductive/Obstetrics negative OB ROS                          Anesthesia Physical Anesthesia Plan  ASA: II  Anesthesia Plan: MAC   Post-op Pain Management:    Induction: Intravenous  Airway Management Planned:   Additional Equipment:   Intra-op Plan:   Post-operative Plan:   Informed Consent: I have reviewed the patients History and Physical, chart, labs and discussed the procedure including the risks, benefits and alternatives for the proposed anesthesia with the patient or authorized representative who has indicated his/her understanding and acceptance.   Dental advisory given  Plan Discussed with: CRNA  Anesthesia Plan Comments:         Anesthesia Quick Evaluation

## 2013-12-22 ENCOUNTER — Encounter (HOSPITAL_COMMUNITY): Payer: Self-pay | Admitting: Internal Medicine

## 2013-12-28 DIAGNOSIS — D649 Anemia, unspecified: Secondary | ICD-10-CM | POA: Diagnosis not present

## 2014-01-11 DIAGNOSIS — H409 Unspecified glaucoma: Secondary | ICD-10-CM | POA: Diagnosis not present

## 2014-01-11 DIAGNOSIS — H25039 Anterior subcapsular polar age-related cataract, unspecified eye: Secondary | ICD-10-CM | POA: Diagnosis not present

## 2014-01-11 DIAGNOSIS — H4010X0 Unspecified open-angle glaucoma, stage unspecified: Secondary | ICD-10-CM | POA: Diagnosis not present

## 2014-01-11 DIAGNOSIS — H269 Unspecified cataract: Secondary | ICD-10-CM | POA: Diagnosis not present

## 2014-01-11 DIAGNOSIS — H2510 Age-related nuclear cataract, unspecified eye: Secondary | ICD-10-CM | POA: Diagnosis not present

## 2014-01-11 DIAGNOSIS — H25049 Posterior subcapsular polar age-related cataract, unspecified eye: Secondary | ICD-10-CM | POA: Diagnosis not present

## 2014-01-11 DIAGNOSIS — H25019 Cortical age-related cataract, unspecified eye: Secondary | ICD-10-CM | POA: Diagnosis not present

## 2014-01-12 DIAGNOSIS — D1801 Hemangioma of skin and subcutaneous tissue: Secondary | ICD-10-CM | POA: Diagnosis not present

## 2014-01-12 DIAGNOSIS — L819 Disorder of pigmentation, unspecified: Secondary | ICD-10-CM | POA: Diagnosis not present

## 2014-01-12 DIAGNOSIS — D2370 Other benign neoplasm of skin of unspecified lower limb, including hip: Secondary | ICD-10-CM | POA: Diagnosis not present

## 2014-01-12 DIAGNOSIS — D235 Other benign neoplasm of skin of trunk: Secondary | ICD-10-CM | POA: Diagnosis not present

## 2014-01-19 DIAGNOSIS — M899 Disorder of bone, unspecified: Secondary | ICD-10-CM | POA: Diagnosis not present

## 2014-01-19 DIAGNOSIS — E039 Hypothyroidism, unspecified: Secondary | ICD-10-CM | POA: Diagnosis not present

## 2014-01-19 DIAGNOSIS — E785 Hyperlipidemia, unspecified: Secondary | ICD-10-CM | POA: Diagnosis not present

## 2014-01-19 DIAGNOSIS — I10 Essential (primary) hypertension: Secondary | ICD-10-CM | POA: Diagnosis not present

## 2014-01-19 DIAGNOSIS — D649 Anemia, unspecified: Secondary | ICD-10-CM | POA: Diagnosis not present

## 2014-01-19 DIAGNOSIS — R82998 Other abnormal findings in urine: Secondary | ICD-10-CM | POA: Diagnosis not present

## 2014-01-19 DIAGNOSIS — R809 Proteinuria, unspecified: Secondary | ICD-10-CM | POA: Diagnosis not present

## 2014-01-19 DIAGNOSIS — M949 Disorder of cartilage, unspecified: Secondary | ICD-10-CM | POA: Diagnosis not present

## 2014-01-27 DIAGNOSIS — E785 Hyperlipidemia, unspecified: Secondary | ICD-10-CM | POA: Diagnosis not present

## 2014-01-27 DIAGNOSIS — D649 Anemia, unspecified: Secondary | ICD-10-CM | POA: Diagnosis not present

## 2014-01-27 DIAGNOSIS — E039 Hypothyroidism, unspecified: Secondary | ICD-10-CM | POA: Diagnosis not present

## 2014-01-27 DIAGNOSIS — Z1331 Encounter for screening for depression: Secondary | ICD-10-CM | POA: Diagnosis not present

## 2014-01-27 DIAGNOSIS — M4120 Other idiopathic scoliosis, site unspecified: Secondary | ICD-10-CM | POA: Diagnosis not present

## 2014-01-27 DIAGNOSIS — I10 Essential (primary) hypertension: Secondary | ICD-10-CM | POA: Diagnosis not present

## 2014-01-27 DIAGNOSIS — Z0000 Encounter for general adult medical examination without abnormal findings: Secondary | ICD-10-CM | POA: Diagnosis not present

## 2014-01-27 DIAGNOSIS — Q2733 Arteriovenous malformation of digestive system vessel: Secondary | ICD-10-CM | POA: Diagnosis not present

## 2014-03-22 DIAGNOSIS — H25049 Posterior subcapsular polar age-related cataract, unspecified eye: Secondary | ICD-10-CM | POA: Diagnosis not present

## 2014-03-22 DIAGNOSIS — H409 Unspecified glaucoma: Secondary | ICD-10-CM | POA: Diagnosis not present

## 2014-03-22 DIAGNOSIS — H2510 Age-related nuclear cataract, unspecified eye: Secondary | ICD-10-CM | POA: Diagnosis not present

## 2014-03-22 DIAGNOSIS — H25019 Cortical age-related cataract, unspecified eye: Secondary | ICD-10-CM | POA: Diagnosis not present

## 2014-03-22 DIAGNOSIS — H269 Unspecified cataract: Secondary | ICD-10-CM | POA: Diagnosis not present

## 2014-03-22 DIAGNOSIS — H4011X0 Primary open-angle glaucoma, stage unspecified: Secondary | ICD-10-CM | POA: Diagnosis not present

## 2014-05-12 DIAGNOSIS — D649 Anemia, unspecified: Secondary | ICD-10-CM | POA: Diagnosis not present

## 2014-07-21 DIAGNOSIS — Z23 Encounter for immunization: Secondary | ICD-10-CM | POA: Diagnosis not present

## 2014-08-31 DIAGNOSIS — D649 Anemia, unspecified: Secondary | ICD-10-CM | POA: Diagnosis not present

## 2014-09-21 DIAGNOSIS — H401231 Low-tension glaucoma, bilateral, mild stage: Secondary | ICD-10-CM | POA: Diagnosis not present

## 2014-09-21 DIAGNOSIS — H04123 Dry eye syndrome of bilateral lacrimal glands: Secondary | ICD-10-CM | POA: Diagnosis not present

## 2014-09-21 DIAGNOSIS — Z961 Presence of intraocular lens: Secondary | ICD-10-CM | POA: Diagnosis not present

## 2014-11-21 ENCOUNTER — Other Ambulatory Visit: Payer: Self-pay

## 2014-11-21 DIAGNOSIS — Z1231 Encounter for screening mammogram for malignant neoplasm of breast: Secondary | ICD-10-CM

## 2014-11-22 DIAGNOSIS — D649 Anemia, unspecified: Secondary | ICD-10-CM | POA: Diagnosis not present

## 2014-11-25 ENCOUNTER — Ambulatory Visit
Admission: RE | Admit: 2014-11-25 | Discharge: 2014-11-25 | Disposition: A | Discharge status: Home / Self Care | Payer: Medicare Other | Source: Ambulatory Visit

## 2014-11-25 DIAGNOSIS — Z1231 Encounter for screening mammogram for malignant neoplasm of breast: Secondary | ICD-10-CM | POA: Diagnosis not present

## 2015-01-19 ENCOUNTER — Other Ambulatory Visit: Payer: Self-pay | Admitting: Obstetrics & Gynecology

## 2015-01-19 DIAGNOSIS — N760 Acute vaginitis: Secondary | ICD-10-CM | POA: Diagnosis not present

## 2015-01-19 DIAGNOSIS — Z124 Encounter for screening for malignant neoplasm of cervix: Secondary | ICD-10-CM | POA: Diagnosis not present

## 2015-01-19 DIAGNOSIS — Z1272 Encounter for screening for malignant neoplasm of vagina: Secondary | ICD-10-CM | POA: Diagnosis not present

## 2015-01-20 LAB — CYTOLOGY - PAP

## 2015-01-25 DIAGNOSIS — E785 Hyperlipidemia, unspecified: Secondary | ICD-10-CM | POA: Diagnosis not present

## 2015-01-25 DIAGNOSIS — R8290 Unspecified abnormal findings in urine: Secondary | ICD-10-CM | POA: Diagnosis not present

## 2015-01-25 DIAGNOSIS — E039 Hypothyroidism, unspecified: Secondary | ICD-10-CM | POA: Diagnosis not present

## 2015-01-25 DIAGNOSIS — I10 Essential (primary) hypertension: Secondary | ICD-10-CM | POA: Diagnosis not present

## 2015-01-25 DIAGNOSIS — N390 Urinary tract infection, site not specified: Secondary | ICD-10-CM | POA: Diagnosis not present

## 2015-01-25 DIAGNOSIS — M859 Disorder of bone density and structure, unspecified: Secondary | ICD-10-CM | POA: Diagnosis not present

## 2015-02-01 DIAGNOSIS — Z6824 Body mass index (BMI) 24.0-24.9, adult: Secondary | ICD-10-CM | POA: Diagnosis not present

## 2015-02-01 DIAGNOSIS — D649 Anemia, unspecified: Secondary | ICD-10-CM | POA: Diagnosis not present

## 2015-02-01 DIAGNOSIS — Z0000 Encounter for general adult medical examination without abnormal findings: Secondary | ICD-10-CM | POA: Diagnosis not present

## 2015-02-01 DIAGNOSIS — Z1389 Encounter for screening for other disorder: Secondary | ICD-10-CM | POA: Diagnosis not present

## 2015-02-01 DIAGNOSIS — E785 Hyperlipidemia, unspecified: Secondary | ICD-10-CM | POA: Diagnosis not present

## 2015-02-01 DIAGNOSIS — I10 Essential (primary) hypertension: Secondary | ICD-10-CM | POA: Diagnosis not present

## 2015-02-01 DIAGNOSIS — M419 Scoliosis, unspecified: Secondary | ICD-10-CM | POA: Diagnosis not present

## 2015-02-01 DIAGNOSIS — E039 Hypothyroidism, unspecified: Secondary | ICD-10-CM | POA: Diagnosis not present

## 2015-02-01 DIAGNOSIS — M859 Disorder of bone density and structure, unspecified: Secondary | ICD-10-CM | POA: Diagnosis not present

## 2015-04-10 DIAGNOSIS — H04123 Dry eye syndrome of bilateral lacrimal glands: Secondary | ICD-10-CM | POA: Diagnosis not present

## 2015-04-10 DIAGNOSIS — Z961 Presence of intraocular lens: Secondary | ICD-10-CM | POA: Diagnosis not present

## 2015-04-10 DIAGNOSIS — H401231 Low-tension glaucoma, bilateral, mild stage: Secondary | ICD-10-CM | POA: Diagnosis not present

## 2015-05-19 ENCOUNTER — Ambulatory Visit: Payer: Medicare Other | Admitting: Podiatry

## 2015-05-25 ENCOUNTER — Ambulatory Visit (INDEPENDENT_AMBULATORY_CARE_PROVIDER_SITE_OTHER): Payer: Medicare Other | Admitting: Podiatry

## 2015-05-25 ENCOUNTER — Encounter: Payer: Self-pay | Admitting: Podiatry

## 2015-05-25 VITALS — BP 149/60 | HR 80 | Resp 12

## 2015-05-25 DIAGNOSIS — L600 Ingrowing nail: Secondary | ICD-10-CM

## 2015-05-25 DIAGNOSIS — M79674 Pain in right toe(s): Secondary | ICD-10-CM | POA: Diagnosis not present

## 2015-05-25 DIAGNOSIS — B351 Tinea unguium: Secondary | ICD-10-CM

## 2015-05-25 NOTE — Progress Notes (Signed)
   Subjective:    Patient ID: Monique Wright, female    DOB: [birth year], 79 y.o.   MRN: [id number]  HPI PT STATED RT FOOT 5TH TOENAIL IS BEEN HURTING FOR 5 WEEKS. THE TOENAIL IS GETTING WORSE ESPECIALLY WHEN WEARING CLOSED SHOES OR PRESSURE. TRIED USED ICE BUT NO HELP.   Review of Systems  Skin: Positive for color change.       Objective:   Physical Exam: I have reviewed her past mental history medications allergies surgery social history and review of systems. I also reviewed her chief complaint statement. Pulses are palpable bilateral. Neurologic sensorium is intact per Semmes-Weinstein monofilament. Deep tendon reflexes are intact bilateral and muscle strength is 5 over 5 dorsiflexion plantar flexors and inverters everters all intrinsic musculature is intact. Orthopedic evaluation of his result joints distal to the ankle for range of motion without crepitation. Cutaneous evaluation and strict supple well-hydrated cutis bilateral. Fifth digital nail plate appears to be thickened and tender on palpation and does not demonstrate any type of infection.        Assessment & Plan:  Assessment: Nail dystrophy fifth right.  Plan: Discussed with the patient today options regarding this nail plate such as removing it permanently or just debriding it on a regular basis. She chose debridement so we debrided the nail today and thinned down to the best of our ability I will follow up with her in a couple weeks to make sure this is all we need to do it at that time she is still painful we will go ahead and remove the nail plate.

## 2015-06-08 ENCOUNTER — Ambulatory Visit: Payer: Medicare Other | Admitting: Podiatry

## 2015-06-14 DIAGNOSIS — D1721 Benign lipomatous neoplasm of skin and subcutaneous tissue of right arm: Secondary | ICD-10-CM | POA: Diagnosis not present

## 2015-07-17 ENCOUNTER — Encounter: Payer: Self-pay | Admitting: Internal Medicine

## 2015-08-02 DIAGNOSIS — E039 Hypothyroidism, unspecified: Secondary | ICD-10-CM | POA: Diagnosis not present

## 2015-08-02 DIAGNOSIS — Z23 Encounter for immunization: Secondary | ICD-10-CM | POA: Diagnosis not present

## 2015-08-02 DIAGNOSIS — I10 Essential (primary) hypertension: Secondary | ICD-10-CM | POA: Diagnosis not present

## 2015-08-02 DIAGNOSIS — Z6824 Body mass index (BMI) 24.0-24.9, adult: Secondary | ICD-10-CM | POA: Diagnosis not present

## 2015-08-02 DIAGNOSIS — E785 Hyperlipidemia, unspecified: Secondary | ICD-10-CM | POA: Diagnosis not present

## 2015-08-02 DIAGNOSIS — R05 Cough: Secondary | ICD-10-CM | POA: Diagnosis not present

## 2015-08-02 DIAGNOSIS — D649 Anemia, unspecified: Secondary | ICD-10-CM | POA: Diagnosis not present

## 2015-10-10 DIAGNOSIS — H401211 Low-tension glaucoma, right eye, mild stage: Secondary | ICD-10-CM | POA: Diagnosis not present

## 2015-10-10 DIAGNOSIS — H401221 Low-tension glaucoma, left eye, mild stage: Secondary | ICD-10-CM | POA: Diagnosis not present

## 2015-11-02 DIAGNOSIS — H401222 Low-tension glaucoma, left eye, moderate stage: Secondary | ICD-10-CM | POA: Diagnosis not present

## 2015-11-02 DIAGNOSIS — H5340 Unspecified visual field defects: Secondary | ICD-10-CM | POA: Diagnosis not present

## 2015-11-02 DIAGNOSIS — H04123 Dry eye syndrome of bilateral lacrimal glands: Secondary | ICD-10-CM | POA: Diagnosis not present

## 2015-11-02 DIAGNOSIS — H401212 Low-tension glaucoma, right eye, moderate stage: Secondary | ICD-10-CM | POA: Diagnosis not present

## 2015-11-14 ENCOUNTER — Other Ambulatory Visit: Payer: Self-pay

## 2015-11-14 DIAGNOSIS — Z1231 Encounter for screening mammogram for malignant neoplasm of breast: Secondary | ICD-10-CM

## 2015-11-30 ENCOUNTER — Ambulatory Visit
Admission: RE | Admit: 2015-11-30 | Discharge: 2015-11-30 | Disposition: A | Discharge status: Home / Self Care | Payer: Medicare Other | Source: Ambulatory Visit

## 2015-11-30 DIAGNOSIS — Z1231 Encounter for screening mammogram for malignant neoplasm of breast: Secondary | ICD-10-CM

## 2015-12-29 DIAGNOSIS — M4125 Other idiopathic scoliosis, thoracolumbar region: Secondary | ICD-10-CM | POA: Diagnosis not present

## 2015-12-29 DIAGNOSIS — M5136 Other intervertebral disc degeneration, lumbar region: Secondary | ICD-10-CM | POA: Diagnosis not present

## 2016-01-29 DIAGNOSIS — N390 Urinary tract infection, site not specified: Secondary | ICD-10-CM | POA: Diagnosis not present

## 2016-01-29 DIAGNOSIS — I10 Essential (primary) hypertension: Secondary | ICD-10-CM | POA: Diagnosis not present

## 2016-01-29 DIAGNOSIS — M859 Disorder of bone density and structure, unspecified: Secondary | ICD-10-CM | POA: Diagnosis not present

## 2016-01-29 DIAGNOSIS — R8290 Unspecified abnormal findings in urine: Secondary | ICD-10-CM | POA: Diagnosis not present

## 2016-01-29 DIAGNOSIS — E038 Other specified hypothyroidism: Secondary | ICD-10-CM | POA: Diagnosis not present

## 2016-01-29 DIAGNOSIS — E784 Other hyperlipidemia: Secondary | ICD-10-CM | POA: Diagnosis not present

## 2016-01-30 DIAGNOSIS — Z01419 Encounter for gynecological examination (general) (routine) without abnormal findings: Secondary | ICD-10-CM | POA: Diagnosis not present

## 2016-01-30 DIAGNOSIS — Z952 Presence of prosthetic heart valve: Secondary | ICD-10-CM | POA: Diagnosis not present

## 2016-01-31 DIAGNOSIS — I10 Essential (primary) hypertension: Secondary | ICD-10-CM | POA: Diagnosis not present

## 2016-01-31 DIAGNOSIS — Z6825 Body mass index (BMI) 25.0-25.9, adult: Secondary | ICD-10-CM | POA: Diagnosis not present

## 2016-02-07 DIAGNOSIS — I10 Essential (primary) hypertension: Secondary | ICD-10-CM | POA: Diagnosis not present

## 2016-02-07 DIAGNOSIS — M419 Scoliosis, unspecified: Secondary | ICD-10-CM | POA: Diagnosis not present

## 2016-02-07 DIAGNOSIS — Q265 Anomalous portal venous connection: Secondary | ICD-10-CM | POA: Diagnosis not present

## 2016-02-07 DIAGNOSIS — E784 Other hyperlipidemia: Secondary | ICD-10-CM | POA: Diagnosis not present

## 2016-02-07 DIAGNOSIS — E039 Hypothyroidism, unspecified: Secondary | ICD-10-CM | POA: Diagnosis not present

## 2016-02-07 DIAGNOSIS — M859 Disorder of bone density and structure, unspecified: Secondary | ICD-10-CM | POA: Diagnosis not present

## 2016-02-07 DIAGNOSIS — D649 Anemia, unspecified: Secondary | ICD-10-CM | POA: Diagnosis not present

## 2016-02-07 DIAGNOSIS — E663 Overweight: Secondary | ICD-10-CM | POA: Diagnosis not present

## 2016-02-07 DIAGNOSIS — Z6825 Body mass index (BMI) 25.0-25.9, adult: Secondary | ICD-10-CM | POA: Diagnosis not present

## 2016-02-07 DIAGNOSIS — Z1389 Encounter for screening for other disorder: Secondary | ICD-10-CM | POA: Diagnosis not present

## 2016-02-07 DIAGNOSIS — R05 Cough: Secondary | ICD-10-CM | POA: Diagnosis not present

## 2016-02-07 DIAGNOSIS — Z0000 Encounter for general adult medical examination without abnormal findings: Secondary | ICD-10-CM | POA: Diagnosis not present

## 2016-03-01 DIAGNOSIS — H16103 Unspecified superficial keratitis, bilateral: Secondary | ICD-10-CM | POA: Diagnosis not present

## 2016-03-01 DIAGNOSIS — H401232 Low-tension glaucoma, bilateral, moderate stage: Secondary | ICD-10-CM | POA: Diagnosis not present

## 2016-03-01 DIAGNOSIS — H04123 Dry eye syndrome of bilateral lacrimal glands: Secondary | ICD-10-CM | POA: Diagnosis not present

## 2016-04-25 ENCOUNTER — Ambulatory Visit (INDEPENDENT_AMBULATORY_CARE_PROVIDER_SITE_OTHER): Payer: Medicare Other

## 2016-04-25 ENCOUNTER — Encounter: Payer: Self-pay | Admitting: Podiatry

## 2016-04-25 ENCOUNTER — Ambulatory Visit (INDEPENDENT_AMBULATORY_CARE_PROVIDER_SITE_OTHER): Payer: Medicare Other | Admitting: Podiatry

## 2016-04-25 VITALS — BP 135/90 | HR 88 | Resp 12

## 2016-04-25 DIAGNOSIS — M722 Plantar fascial fibromatosis: Secondary | ICD-10-CM

## 2016-04-25 MED ORDER — METHYLPREDNISOLONE 4 MG PO TBPK: ORAL_TABLET | ORAL | Status: DC

## 2016-04-25 MED ORDER — MELOXICAM 15 MG PO TABS: 15.0000 mg | ORAL_TABLET | Freq: Every day | ORAL | Status: DC

## 2016-04-25 NOTE — Patient Instructions (Signed)

## 2016-04-25 NOTE — Progress Notes (Signed)
   Subjective:    Patient ID: Monique Wright, female    DOB: 07-07-1936, 80 y.o.   MRN: [id number]  HPI: She presents today with a 3 month duration of pain to her left heel. She denies trauma to the foot. She states is particularly that actually sitting for a while expect a 3 ambulate. She's tried nothing to alleviate her symptoms.  Review of Systems  Musculoskeletal: Positive for gait problem.       Objective:   Physical Exam: Vital signs are stable she is alert and oriented 3 pulses are palpable. Neurologic sensorium is intact. Deep tendon reflexes are intact muscle strength is normal and symmetrical bilateral. Orthopedic evaluation of his wrist pain on palpation medially compared to the left heel otherwise all joints distal to the ankle range of motion without crepitation. Cutaneous evaluation does not demonstrates any type of open lesions or wounds. Radiographs taken today do demonstrate soft tissue increase in density of the plantar fascia calcaneal insertion site consistent with plantar fasciitis.        Assessment & Plan:  Plantar fasciitis left heel.  Plan: I injected left heel today with Kenalog and local anesthetic posterior plantar fascia brace and a night splint. Started on multiple Medrol Dosepak to be followed by meloxicam. We'll follow-up with her in 1 month. I encouraged her to check her blood pressure regularly. Notify us with any readings also normal.

## 2016-05-28 ENCOUNTER — Ambulatory Visit (INDEPENDENT_AMBULATORY_CARE_PROVIDER_SITE_OTHER): Payer: Medicare Other | Admitting: Podiatry

## 2016-05-28 ENCOUNTER — Encounter: Payer: Self-pay | Admitting: Podiatry

## 2016-05-28 VITALS — BP 153/88 | HR 88 | Resp 12

## 2016-05-28 DIAGNOSIS — M722 Plantar fascial fibromatosis: Secondary | ICD-10-CM

## 2016-05-28 NOTE — Progress Notes (Signed)
She presents today for follow-up of her plantar fasciitis of the right foot. She states that it is approximately 75% improved.  Objective: Vital signs are stable alert and oriented 3. Pulses are palpable. She has failed palpation medially continue tubercle of the right heel though much decreased from previous exam.  Assessment: Plantar fasciitis of the right heel.  Plan: I encouraged her to continue to take her meloxicam since she had not  then taking it. I injected the right heel today and we discussed appropriate shoe gear stretching exercises and ice therapy. I will follow-up with her in the near future for surgical intervention if necessary.

## 2016-05-29 ENCOUNTER — Telehealth: Payer: Self-pay | Admitting: *Deleted

## 2016-05-29 NOTE — Telephone Encounter (Addendum)
Pt states had an injection 1 month ago and no problems, but the one yesterday caused the heel to hurt so bad she is having to walk on her forefoot.  I told pt that on occasion an steroid injection could cause a flare of symptoms, to begin aggressive icing today at least 3-4 times this evening for 15-20 mins each period and make sure to take the Mobic as directed and cover any residual pain with OTC Tylenol, may need to do for a day or 2.  I told pt I would inform Dr. Milinda Pointer and call if there were a change of instructions. Pt states understanding.

## 2016-05-29 NOTE — Telephone Encounter (Signed)
No. It will get better.  Sounds like a flare to me to.

## 2016-06-04 DIAGNOSIS — B029 Zoster without complications: Secondary | ICD-10-CM | POA: Diagnosis not present

## 2016-06-11 ENCOUNTER — Ambulatory Visit (INDEPENDENT_AMBULATORY_CARE_PROVIDER_SITE_OTHER): Payer: Medicare Other | Admitting: Podiatry

## 2016-06-11 ENCOUNTER — Encounter: Payer: Self-pay | Admitting: Podiatry

## 2016-06-11 DIAGNOSIS — M629 Disorder of muscle, unspecified: Secondary | ICD-10-CM | POA: Diagnosis not present

## 2016-06-11 DIAGNOSIS — M722 Plantar fascial fibromatosis: Secondary | ICD-10-CM

## 2016-06-11 NOTE — Progress Notes (Signed)
Ms. Flinn presents today for follow-up of her right foot pain. She had been diagnosed with plantar fasciitis and received another cortisone injection the last she was in. She was injected on Tuesday and Wednesday afternoon she felt severe pain in her right foot and was told by our nursing staff and more than likely with steroid flare and she was told how to treat this. She states that her pain has been so severe recently that she did not think that she can handle any more.  Objective: Vital signs are stable she is alert and oriented 3. She denies fever chills nausea vomiting muscle aches and pains. She denies any fever or swelling to the painful area of the right foot. She has pain on palpation medial calcaneal tubercle and along the medial band of the plantar fascia with some ecchymosis in the area. There is no swelling. Left foot demonstrates a cavus foot with a nice palpable medial band where is now the right foot does not demonstrate a medial band at all. I believe that she has a plantar fascial tear.  Assessment: Rupture of the plantar fascia right.  Plan: Place her in [place] today and encouraged her to utilize Cam Lueras at all times during the day and a night splint at bedtime. On follow-up with her in approximately one month for reevaluation.

## 2016-06-25 ENCOUNTER — Ambulatory Visit (INDEPENDENT_AMBULATORY_CARE_PROVIDER_SITE_OTHER): Payer: Medicare Other | Admitting: Podiatry

## 2016-06-25 ENCOUNTER — Encounter: Payer: Self-pay | Admitting: Podiatry

## 2016-06-25 DIAGNOSIS — M629 Disorder of muscle, unspecified: Secondary | ICD-10-CM | POA: Diagnosis not present

## 2016-06-26 NOTE — Progress Notes (Signed)
She presents today for follow-up of a tear to the plantar fascia of her right foot. She continues to wear her cam Bob[place] and feels that she is doing much better. [place] and she is concerned about walking.  Objective: Vital signs are stable she is alert and oriented 3. Pulses are palpable. She has much decrease in pain on palpation medial calcaneal tubercle area and site of rupture of the right foot. Nonpalpable plantar fascia right.  Assessment: Plantar fascia rupture right foot.  Plan: I encouraged her to wear her Cam Yiu for the next 3 weeks until she is ready on vacation and will follow up with me immediately upon returning. We will consider getting a pair of orthotics made at that time.

## 2016-07-31 DIAGNOSIS — H5310 Unspecified subjective visual disturbances: Secondary | ICD-10-CM | POA: Diagnosis not present

## 2016-07-31 DIAGNOSIS — G43909 Migraine, unspecified, not intractable, without status migrainosus: Secondary | ICD-10-CM | POA: Diagnosis not present

## 2016-07-31 DIAGNOSIS — Z961 Presence of intraocular lens: Secondary | ICD-10-CM | POA: Diagnosis not present

## 2016-07-31 DIAGNOSIS — H401232 Low-tension glaucoma, bilateral, moderate stage: Secondary | ICD-10-CM | POA: Diagnosis not present

## 2016-08-01 ENCOUNTER — Ambulatory Visit: Payer: Medicare Other | Admitting: Podiatry

## 2016-08-06 ENCOUNTER — Encounter: Payer: Self-pay | Admitting: Podiatry

## 2016-08-06 ENCOUNTER — Ambulatory Visit (INDEPENDENT_AMBULATORY_CARE_PROVIDER_SITE_OTHER): Payer: Medicare Other | Admitting: Podiatry

## 2016-08-06 DIAGNOSIS — M629 Disorder of muscle, unspecified: Secondary | ICD-10-CM | POA: Diagnosis not present

## 2016-08-06 NOTE — Progress Notes (Signed)
She presents today for follow-up of her plantar fascia tear. She continues to wear her Cam Dor[place] but states that she only wears it approximately 60% of the time. She states that her foot is doing much better but is still very painful.  Objective: Vital signs are stable she is alert and oriented 3. Pulses are palpable. No open lesions or wounds. She has much less pain on palpation at the insertion site of the plantar fascia and calcaneus now. Majority of her symptoms are located beneath the medial aspect of the arch along the navicular tuberosity.  Assessment: Plantar fascia rupture resolving.  Plan: Continue consistent use of her Cam R[place]on follow up with her in 3-4 weeks.

## 2016-08-13 DIAGNOSIS — E784 Other hyperlipidemia: Secondary | ICD-10-CM | POA: Diagnosis not present

## 2016-08-15 DIAGNOSIS — I10 Essential (primary) hypertension: Secondary | ICD-10-CM | POA: Diagnosis not present

## 2016-08-15 DIAGNOSIS — D509 Iron deficiency anemia, unspecified: Secondary | ICD-10-CM | POA: Diagnosis not present

## 2016-08-15 DIAGNOSIS — E663 Overweight: Secondary | ICD-10-CM | POA: Diagnosis not present

## 2016-08-15 DIAGNOSIS — M419 Scoliosis, unspecified: Secondary | ICD-10-CM | POA: Diagnosis not present

## 2016-08-15 DIAGNOSIS — M722 Plantar fascial fibromatosis: Secondary | ICD-10-CM | POA: Diagnosis not present

## 2016-08-15 DIAGNOSIS — M859 Disorder of bone density and structure, unspecified: Secondary | ICD-10-CM | POA: Diagnosis not present

## 2016-08-15 DIAGNOSIS — Q265 Anomalous portal venous connection: Secondary | ICD-10-CM | POA: Diagnosis not present

## 2016-08-15 DIAGNOSIS — Z23 Encounter for immunization: Secondary | ICD-10-CM | POA: Diagnosis not present

## 2016-08-15 DIAGNOSIS — E784 Other hyperlipidemia: Secondary | ICD-10-CM | POA: Diagnosis not present

## 2016-09-03 ENCOUNTER — Telehealth: Payer: Self-pay | Admitting: *Deleted

## 2016-09-03 ENCOUNTER — Encounter: Payer: Self-pay | Admitting: Podiatry

## 2016-09-03 ENCOUNTER — Ambulatory Visit (INDEPENDENT_AMBULATORY_CARE_PROVIDER_SITE_OTHER): Payer: Medicare Other | Admitting: Podiatry

## 2016-09-03 VITALS — Resp 16

## 2016-09-03 DIAGNOSIS — M722 Plantar fascial fibromatosis: Secondary | ICD-10-CM | POA: Diagnosis not present

## 2016-09-03 DIAGNOSIS — M2140 Flat foot [pes planus] (acquired), unspecified foot: Secondary | ICD-10-CM

## 2016-09-03 DIAGNOSIS — M629 Disorder of muscle, unspecified: Secondary | ICD-10-CM

## 2016-09-03 DIAGNOSIS — M76829 Posterior tibial tendinitis, unspecified leg: Secondary | ICD-10-CM

## 2016-09-03 NOTE — Progress Notes (Signed)
She presents today for follow-up of a plantar fascia tear of the right foot states that this boot is making my foot hurt worse and states that over the weekend 10 days ago something started to really hurt the medial longitudinal arch is very warm every since. She states that just something over dressing should.  Objective: Vital signs are stable alert and oriented 3. She has pain on palpation posterior tibial tendon is courses beneath the medial malleolus extending to the navicular tuberosity and plantar medial arch. Much decrease on of pain on palpation to the plantar fascia calcaneal insertion site.  Assessment: Rupture of the plantar fascia of the right foot with posterior tibial tendinitis right. At this point I recommended an MRI since we are starting to regress.

## 2016-09-05 ENCOUNTER — Telehealth: Payer: Self-pay | Admitting: *Deleted

## 2016-09-05 NOTE — Telephone Encounter (Signed)
"  Patient on for tomorrow at 3:20pm.  Her [employer] state requires authorization.  She needs to be here at 3:00pm

## 2016-09-05 NOTE — Telephone Encounter (Signed)
I faxed authorization to [place].  Authorization is good from 09/05/2016 to 10/04/2016.  Order ID is [id number].

## 2016-09-06 ENCOUNTER — Ambulatory Visit
Admission: RE | Admit: 2016-09-06 | Discharge: 2016-09-06 | Disposition: A | Discharge status: Home / Self Care | Payer: Medicare Other | Source: Ambulatory Visit | Attending: Podiatry | Admitting: Podiatry

## 2016-09-06 DIAGNOSIS — S86811A Strain of other muscle(s) and tendon(s) at lower leg level, right leg, initial encounter: Secondary | ICD-10-CM | POA: Diagnosis not present

## 2016-09-06 DIAGNOSIS — M629 Disorder of muscle, unspecified: Secondary | ICD-10-CM

## 2016-09-06 DIAGNOSIS — M76829 Posterior tibial tendinitis, unspecified leg: Secondary | ICD-10-CM

## 2016-09-10 NOTE — Telephone Encounter (Signed)
Pt states she had her MRI Friday and would like to schedule an appt with Dr. Milinda Pointer to review the results.

## 2016-09-19 ENCOUNTER — Telehealth: Payer: Self-pay | Admitting: *Deleted

## 2016-09-19 ENCOUNTER — Encounter: Payer: Self-pay | Admitting: Podiatry

## 2016-09-19 ENCOUNTER — Ambulatory Visit (INDEPENDENT_AMBULATORY_CARE_PROVIDER_SITE_OTHER): Payer: Medicare Other | Admitting: Podiatry

## 2016-09-19 DIAGNOSIS — M629 Disorder of muscle, unspecified: Secondary | ICD-10-CM

## 2016-09-19 NOTE — Telephone Encounter (Signed)
"  You scheduled me for surgery on December 12.  Is there anyway you can move me to the week before?"  No, he does not have any time that week.  "Can you put me down if there's any cancellations for the 8th?"  Yes, I will put you on the waiting list.

## 2016-09-19 NOTE — Patient Instructions (Signed)
Pre-Operative Instructions  Congratulations, you have decided to take an important step to improving your quality of life.  You can be assured that the doctors of [facility] will be with you every step of the way.  1. Plan to be at the surgery center/hospital at least 1 (one) hour prior to your scheduled time unless otherwise directed by the surgical center/hospital staff.  You must have a responsible adult accompany you, remain during the surgery and drive you home.  Make sure you have directions to the surgical center/hospital and know how to get there on time. 2. For hospital based surgery you will need to obtain a history and physical form from your family physician within 1 month prior to the date of surgery- we will give you a form for you primary physician.  3. We make every effort to accommodate the date you request for surgery.  There are however, times where surgery dates or times have to be moved.  We will contact you as soon as possible if a change in schedule is required.   4. No Aspirin/Ibuprofen for one week before surgery.  If you are on aspirin, any non-steroidal anti-inflammatory medications (Mobic, Aleve, Ibuprofen) you should stop taking it 7 days prior to your surgery.  You make take Tylenol  For pain prior to surgery.  5. Medications- If you are taking daily heart and blood pressure medications, seizure, reflux, allergy, asthma, anxiety, pain or diabetes medications, make sure the surgery center/hospital is aware before the day of surgery so they may notify you which medications to take or avoid the day of surgery. 6. No food or drink after midnight the night before surgery unless directed otherwise by surgical center/hospital staff. 7. No alcoholic beverages 24 hours prior to surgery.  No smoking 24 hours prior to or 24 hours after surgery. 8. Wear loose pants or shorts- loose enough to fit over bandages, boots, and casts. 9. No slip on shoes, sneakers are best. 10. Bring  your boot with you to the surgery center/hospital.  Also bring crutches or a Maugeri if your physician has prescribed it for you.  If you do not have this equipment, it will be provided for you after surgery. 11. If you have not been contracted by the surgery center/hospital by the day before your surgery, call to confirm the date and time of your surgery. 12. Leave-time from work may vary depending on the type of surgery you have.  Appropriate arrangements should be made prior to surgery with your employer. 13. Prescriptions will be provided immediately following surgery by your doctor.  Have these filled as soon as possible after surgery and take the medication as directed. 14. Remove nail polish on the operative foot. 15. Wash the night before surgery.  The night before surgery wash the foot and leg well with the antibacterial soap provided and water paying special attention to beneath the toenails and in between the toes.  Rinse thoroughly with water and dry well with a towel.  Perform this wash unless told not to do so by your physician.  Enclosed: 1 Ice pack (please put in freezer the night before surgery)   1 Hibiclens skin cleaner   Pre-op Instructions  If you have any questions regarding the instructions, do not hesitate to call our office.  [place]: [address] [place], [place] [address] [phone]  [place]: [address] [place], [place] [address] [phone]  [place]: [address]  [place], [place] [address] [phone]   Dr.   Norman Regal DPM, Dr. Matthew Wagoner DPM, Dr. M. Todd Hyatt DPM, Dr. Titorya Stover DPM 

## 2016-09-19 NOTE — Telephone Encounter (Signed)
"  I have left a couple of messages.  My question is do you have anything open next week?"  I informed patient that there are not any openings next week.

## 2016-09-20 ENCOUNTER — Encounter (INDEPENDENT_AMBULATORY_CARE_PROVIDER_SITE_OTHER): Payer: Self-pay | Admitting: Orthopedic Surgery

## 2016-09-20 ENCOUNTER — Ambulatory Visit (INDEPENDENT_AMBULATORY_CARE_PROVIDER_SITE_OTHER): Payer: Medicare Other | Admitting: Orthopedic Surgery

## 2016-09-20 ENCOUNTER — Telehealth (INDEPENDENT_AMBULATORY_CARE_PROVIDER_SITE_OTHER): Payer: Self-pay | Admitting: *Deleted

## 2016-09-20 ENCOUNTER — Other Ambulatory Visit (INDEPENDENT_AMBULATORY_CARE_PROVIDER_SITE_OTHER): Payer: Self-pay | Admitting: Family

## 2016-09-20 VITALS — Ht 64.0 in | Wt 140.0 lb

## 2016-09-20 DIAGNOSIS — M722 Plantar fascial fibromatosis: Secondary | ICD-10-CM | POA: Diagnosis not present

## 2016-09-20 DIAGNOSIS — M6701 Short Achilles tendon (acquired), right ankle: Secondary | ICD-10-CM | POA: Diagnosis not present

## 2016-09-20 MED ORDER — NITROGLYCERIN 0.2 MG/HR TD PT24: 0.2000 mg | MEDICATED_PATCH | Freq: Every day | TRANSDERMAL | 12 refills | Status: DC

## 2016-09-20 MED ORDER — DICLOFENAC SODIUM 1 % TD GEL: 2.0000 g | Freq: Four times a day (QID) | TRANSDERMAL | 2 refills | Status: AC | PRN

## 2016-09-20 NOTE — Progress Notes (Signed)
Office Visit Note   Patient: Monique Wright           Date of Birth: 1936/09/23           MRN: [id number] Visit Date: 09/20/2016              Requested by: Burnard Bunting, MD [place], [place] [address] PCP: Geoffery Lyons, MD   Assessment & Plan: Visit Diagnoses:  1. Short Achilles tendon (acquired), right ankle   2. Plantar fascial fibromatosis of right foot     Plan: Patient was given instructions and demonstrated heel cord stretching to do 5 times a day admitted at time she will start using the nitroglycerin patch apply over the posterior tibial artery change location daily. Continue with her fracture boot. If not improving at follow-up would plan for a gastrocnemius recession and plantar fascia release.  Follow-Up Instructions: Return in about 4 weeks (around 10/18/2016).   Orders:  No orders of the defined types were placed in this encounter.  Meds ordered this encounter  Medications  . nitroGLYCERIN (NITRODUR - DOSED IN MG/24 HR) 0.2 mg/hr patch    Sig: Place 1 patch (0.2 mg total) onto the skin daily.    Dispense:  30 patch    Refill:  12      Procedures: No procedures performed   Clinical Data: No additional findings.   Subjective: Chief Complaint  Patient presents with  . Right Foot - Pain    Plantar fascia pain    Patient is here for persistent right heel pain. She has been dealing with this since July 2017. She was doing at home exercises and icing without relief. She went to [facility]. She was given cortisone injections, treated with night splint and cam [place]. She still has unresolved pain. She was told she had plantar fascia tear and was placed in cam [place] since August. She still has pain ambulating even with cam boot. She has throbbing pain in the evening and difficulty sleeping. She did have MRI which she states shows a more extensive tear. She would like to get second opinion on this matter. She is taking Aleve for her  pain and mobic on a prn basis.  Patient states he's undergone to plantar fascial injections and conservative treatment with anti-inflammatories as well as a fracture boot manipulation rolling her foot on a ball and using ice.  Review of Systems   Objective: Vital Signs: Ht 5\' 4"  (1.626 m)   Wt 140 lb (63.5 kg)   BMI 24.03 kg/m   Physical Exam patient is alert oriented no adenopathy well-dressed normal affect normal respiratory effort she has an antalgic gait she has a good dorsalis pedis and posterior tibial pulse the tarsal tunnel is nontender to palpation the calcaneus is nontender with compression she has dorsiflexion only to neutral with heel cord tightness. The plantar fascia is tender to palpation along the origin. Review of her MRI scan shows some plantar fasciitis with partial tearing of the plantar fascia at its origin.  Ortho Exam  Specialty Comments:  No specialty comments available.  Imaging: No results found.   PMFS History: Patient Active Problem List   Diagnosis Date Noted  . Dyspnea 01/31/2011  . Asymptomatic PVCs 01/31/2011  . Diastolic dysfunction, left ventricle 01/31/2011  . Benign hypertensive heart disease without heart failure 01/31/2011  . Hypercholesterolemia 01/31/2011  . ANGIODYSPLASIA-INTESTINE 08/22/2010  . GASTRITIS 05/07/2010  . ANEMIA, IRON DEFICIENCY 04/16/2010  . FECAL OCCULT BLOOD 04/16/2010  Past Medical History:  Diagnosis Date  . Anemia   . Arthritis    spinal arthritis  . Coronary atherosclerosis of native coronary artery    pt denies having any heart problems and never seen the person who entered this in.  . Fatigue   . Hypertension   . Hypothyroidism   . Left ventricular systolic dysfunction   . Shortness of breath    on exertion    Family History  Problem Relation Age of Onset  . Heart attack Mother   . Lung cancer Father     Past Surgical History:  Procedure Laterality Date  . ABDOMINAL HYSTERECTOMY     tah and bso   . APPENDECTOMY    . ENTEROSCOPY N/A 12/21/2013   Procedure: ENTEROSCOPY;  Surgeon: Irene Shipper, MD;  Location: [facility];  Service: Endoscopy;  Laterality: N/A;  . TONSILECTOMY, ADENOIDECTOMY, BILATERAL MYRINGOTOMY AND TUBES    . torn cartlidge repair Right yrs ago   Social History   Occupational History  . 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 Smoking status: Never Smoker  . Smokeless tobacco: Never Used  . Alcohol use Yes     Comment: occasional  . Drug use: No  . Sexual activity: Not on file

## 2016-09-20 NOTE — Telephone Encounter (Signed)
I tried to call pt twice with no ring. I will try again on onday.

## 2016-09-20 NOTE — Progress Notes (Signed)
She presents today regarding her MRI results to her right foot she states it is just not getting any better and she continues to wear her boot.  Objective: Vital signs are stable she is alert and oriented 3. Pulses are palpable. She still has pain on palpation primarily to the plantar medial aspect of the right foot. The MRI report does demonstrate an extensive tear to the medial band of the plantar fascia.  Assessment: Plantar fasciitis plantar fascia rupture.  Plan: We consented her today for an endoscopic plantar fasciotomy to release the remaining fibers of the tear. She understands this is amenable to we did discuss possible postop complications which may include but are not limited to postop pain bleeding as well as infection recurs need for further surgery. She is dispensed paperwork for the surgery center and I will follow-up with her in the near future.

## 2016-09-20 NOTE — Progress Notes (Signed)
vol

## 2016-09-20 NOTE — Telephone Encounter (Signed)
Pt called stating the voltaren gel was not called in at the pharmacy [phone]

## 2016-09-20 NOTE — Telephone Encounter (Signed)
done

## 2016-09-20 NOTE — Telephone Encounter (Signed)
Can you enter rx?

## 2016-09-23 ENCOUNTER — Telehealth: Payer: Self-pay | Admitting: *Deleted

## 2016-09-23 NOTE — Telephone Encounter (Signed)
"  I'm scheduled for surgery on the 14th.  I want to cancel that, I want to wait until after the holiday."  Would you like to reschedule now or do you want to call back later to reschedule?  "I'll call back to reschedule after the first of the year."

## 2016-09-24 ENCOUNTER — Ambulatory Visit: Payer: Medicare Other | Admitting: Podiatry

## 2016-09-24 NOTE — Telephone Encounter (Signed)
I called and spoke with patient to advise this has been called to pharmacy. She is going to check again.

## 2016-09-26 ENCOUNTER — Telehealth (INDEPENDENT_AMBULATORY_CARE_PROVIDER_SITE_OTHER): Payer: Self-pay

## 2016-09-26 NOTE — Telephone Encounter (Signed)
Ok, tell her to try aspercreme

## 2016-09-26 NOTE — Telephone Encounter (Signed)
rx was denied by pts insurance company. What would you like to give instead.

## 2016-09-26 NOTE — Telephone Encounter (Signed)
I called and sw pt to advise of message below. Voiced understanding and will call with questions.

## 2016-10-17 ENCOUNTER — Encounter (INDEPENDENT_AMBULATORY_CARE_PROVIDER_SITE_OTHER): Payer: Self-pay | Admitting: Orthopedic Surgery

## 2016-10-17 ENCOUNTER — Ambulatory Visit (INDEPENDENT_AMBULATORY_CARE_PROVIDER_SITE_OTHER): Payer: Medicare Other | Admitting: Orthopedic Surgery

## 2016-10-17 DIAGNOSIS — M6702 Short Achilles tendon (acquired), left ankle: Secondary | ICD-10-CM | POA: Diagnosis not present

## 2016-10-17 DIAGNOSIS — M722 Plantar fascial fibromatosis: Secondary | ICD-10-CM | POA: Diagnosis not present

## 2016-10-17 MED ORDER — NABUMETONE 750 MG PO TABS: 750.0000 mg | ORAL_TABLET | Freq: Two times a day (BID) | ORAL | 3 refills | Status: AC | PRN

## 2016-10-17 NOTE — Progress Notes (Signed)
Office Visit Note   Patient: Monique Wright           Date of Birth: 1936-04-06           MRN: [id number] Visit Date: 10/17/2016              Requested by: Monique Bunting, MD [place], [place] [address] PCP: Monique Lyons, MD   Assessment & Plan: Visit Diagnoses:  1. Plantar fascial fibromatosis   2. Achilles tendon contracture, left     Plan: Plan to continue heel cord stretching recommended over-the-counter orthotics she is to resume using her sneakers and discontinue the boot due to her pain in the boot. Recommended 15-20 mm knee-high compression stockings for the venous stasis swelling prescription provided for Relafen continue with the nitroglycerin patch patient's topical nonsteroidal was not approved by the insurance follow-up in 4 weeks.  Follow-Up Instructions: Return in about 4 weeks (around 11/14/2016).   Orders:  No orders of the defined types were placed in this encounter.  Meds ordered this encounter  Medications  . nabumetone (RELAFEN) 750 MG tablet    Sig: Take 1 tablet (750 mg total) by mouth 2 (two) times daily as needed for mild pain or moderate pain. with food    Dispense:  60 tablet    Refill:  3      Procedures: No procedures performed   Clinical Data: No additional findings.   Subjective: Chief Complaint  Patient presents with  . Right Ankle - Follow-up    Right plantar fascial fibromatosis with shorten achilles tendon    Patient presents for follow up right plantar fascial fibromatosis with shorten achilles tendon. She is ambulating with a fracture boot. She is using nitroglycerin patch without much relief. She is feeling about the same.   Patient states she is getting better she states her pain is sometimes a 1-2 and sometimes up to a 5. She states she started to develop pain over the base of the fifth metatarsal secondary to use the fracture boot.  Review of Systems   Objective: Vital Signs: There were no vitals  taken for this visit.  Physical Exam examination patient is alert oriented no adenopathy well-dressed normal affect normal S2 effort she does have an antalgic gait. Examination she has good pulses she does have some venous stasis swelling. There is pitting edema up to her ankle. Patient has improved dorsiflexion with dorsiflexion about 20 past neutral. There is some swelling over the tarsal tunnel but this is nontender to palpation. Patient is a little bit of tenderness to palpation along the fifth metatarsal but no clinical signs or symptoms of a fracture. Patient is point tender to palpation over the origin the plantar fascia.  Ortho Exam  Specialty Comments:  No specialty comments available.  Imaging: No results found.   PMFS History: Patient Active Problem List   Diagnosis Date Noted  . Plantar fascial fibromatosis 10/17/2016  . Achilles tendon contracture, left 10/17/2016  . Dyspnea 01/31/2011  . Asymptomatic PVCs 01/31/2011  . Diastolic dysfunction, left ventricle 01/31/2011  . Benign hypertensive heart disease without heart failure 01/31/2011  . Hypercholesterolemia 01/31/2011  . ANGIODYSPLASIA-INTESTINE 08/22/2010  . GASTRITIS 05/07/2010  . ANEMIA, IRON DEFICIENCY 04/16/2010  . FECAL OCCULT BLOOD 04/16/2010   Past Medical History:  Diagnosis Date  . Anemia   . Arthritis    spinal arthritis  . Coronary atherosclerosis of native coronary artery    pt denies having any heart problems and never seen the  person who entered this in.  . Fatigue   . Hypertension   . Hypothyroidism   . Left ventricular systolic dysfunction   . Shortness of breath    on exertion    Family History  Problem Relation Age of Onset  . Heart attack Mother   . Lung cancer Father     Past Surgical History:  Procedure Laterality Date  . ABDOMINAL HYSTERECTOMY     tah and bso  . APPENDECTOMY    . ENTEROSCOPY N/A 12/21/2013   Procedure: ENTEROSCOPY;  Surgeon: Irene Shipper, MD;  Location: [facility];  Service: Endoscopy;  Laterality: N/A;  . TONSILECTOMY, ADENOIDECTOMY, BILATERAL MYRINGOTOMY AND TUBES    . torn cartlidge repair Right yrs ago   Social History   Occupational History  . 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 Smoking status: Never Smoker  . Smokeless tobacco: Never Used  . Alcohol use Yes     Comment: occasional  . Drug use: No  . Sexual activity: Not on file

## 2016-10-18 ENCOUNTER — Ambulatory Visit (INDEPENDENT_AMBULATORY_CARE_PROVIDER_SITE_OTHER): Payer: Medicare Other | Admitting: Orthopedic Surgery

## 2016-10-31 ENCOUNTER — Other Ambulatory Visit: Payer: Self-pay | Admitting: Internal Medicine

## 2016-10-31 DIAGNOSIS — Z1231 Encounter for screening mammogram for malignant neoplasm of breast: Secondary | ICD-10-CM

## 2016-11-19 ENCOUNTER — Encounter (INDEPENDENT_AMBULATORY_CARE_PROVIDER_SITE_OTHER): Payer: Self-pay

## 2016-11-19 ENCOUNTER — Encounter (INDEPENDENT_AMBULATORY_CARE_PROVIDER_SITE_OTHER): Payer: Self-pay | Admitting: Orthopedic Surgery

## 2016-11-19 ENCOUNTER — Ambulatory Visit (INDEPENDENT_AMBULATORY_CARE_PROVIDER_SITE_OTHER): Payer: Medicare Other | Admitting: Orthopedic Surgery

## 2016-11-19 VITALS — Ht 64.0 in | Wt 140.0 lb

## 2016-11-19 DIAGNOSIS — M76822 Posterior tibial tendinitis, left leg: Secondary | ICD-10-CM | POA: Diagnosis not present

## 2016-11-19 NOTE — Progress Notes (Signed)
Office Visit Note   Patient: Monique Wright           Date of Birth: 1936/04/26           MRN: [id number] Visit Date: 11/19/2016              Requested by: Burnard Bunting, MD [address] [place], [place] [address] PCP: Geoffery Lyons, MD  Chief Complaint  Patient presents with  . Right Foot - Follow-up    Right plantar fascial fibromatosis with shorten achilles tendon    HPI: Patient presents today for follow up right foot. She has been wearing vive medical compression stockings 15-21mm. She has been taking relafen and nitroglycerin patch without relief. She states she is doing heel cord stretching but feels she is not much better. Maxcine Ham, RT  Patient states that the pain on the plantar aspect of her heel has resolved. She states she now has pain over the posterior tibial tendon and peroneal tendons.  Assessment & Plan: Visit Diagnoses:  1. Posterior tibial tendinitis, left leg   #2 peroneal tendinitis. #3 resolved plantar fasciitis and improving heel cord tightness  Plan: We'll place her in an ASO she will continue with the nitroglycerin patch continue with heel cord stretching continue with orthotics continue with the compression stockings follow-up in 4 weeks.  Follow-Up Instructions: Return in about 4 weeks (around 12/17/2016).   Ortho Exam Examination patient is alert oriented no adenopathy well-dressed normal affect, rest whenever she has normal gait. Her plantar fascia is no longer tender to palpation lateral compression the calcaneus is nontender she has good pulses she still has some venous stasis swelling. Patient has no tenderness to palpation along the Achilles she has no pain with plantarflexion or dorsiflexion she does have tenderness to palpation over the peroneal tendons as well as over the posterior tibial tendon. She has good inversion strength.  Imaging: No results found.  Orders:  No orders of the defined types were placed in this  encounter.  No orders of the defined types were placed in this encounter.    Procedures: No procedures performed  Clinical Data: No additional findings.  Subjective: Review of Systems  Objective: Vital Signs: Ht 5\' 4"  (1.626 m)   Wt 140 lb (63.5 kg)   BMI 24.03 kg/m   Specialty Comments:  No specialty comments available.  PMFS History: Patient Active Problem List   Diagnosis Date Noted  . Posterior tibial tendinitis, left leg 11/19/2016  . Plantar fascial fibromatosis 10/17/2016  . Achilles tendon contracture, left 10/17/2016  . Dyspnea 01/31/2011  . Asymptomatic PVCs 01/31/2011  . Diastolic dysfunction, left ventricle 01/31/2011  . Benign hypertensive heart disease without heart failure 01/31/2011  . Hypercholesterolemia 01/31/2011  . ANGIODYSPLASIA-INTESTINE 08/22/2010  . GASTRITIS 05/07/2010  . ANEMIA, IRON DEFICIENCY 04/16/2010  . FECAL OCCULT BLOOD 04/16/2010   Past Medical History:  Diagnosis Date  . Anemia   . Arthritis    spinal arthritis  . Coronary atherosclerosis of native coronary artery    pt denies having any heart problems and never seen the person who entered this in.  . Fatigue   . Hypertension   . Hypothyroidism   . Left ventricular systolic dysfunction   . Shortness of breath    on exertion    Family History  Problem Relation Age of Onset  . Heart attack Mother   . Lung cancer Father     Past Surgical History:  Procedure Laterality Date  . ABDOMINAL HYSTERECTOMY  tah and bso  . APPENDECTOMY    . ENTEROSCOPY N/A 12/21/2013   Procedure: ENTEROSCOPY;  Surgeon: Irene Shipper, MD;  Location: [facility];  Service: Endoscopy;  Laterality: N/A;  . TONSILECTOMY, ADENOIDECTOMY, BILATERAL MYRINGOTOMY AND TUBES    . torn cartlidge repair Right yrs ago   Social History   Occupational History  . 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 Smoking status: Never Smoker  . Smokeless tobacco: Never Used  . Alcohol use  Yes     Comment: occasional  . Drug use: No  . Sexual activity: Not on file

## 2016-11-25 ENCOUNTER — Telehealth (INDEPENDENT_AMBULATORY_CARE_PROVIDER_SITE_OTHER): Payer: Self-pay | Admitting: Orthopedic Surgery

## 2016-11-25 NOTE — Telephone Encounter (Signed)
PATIENT CALLED NEEDING ADVICE ON HOW TO PUT ON HER FOOT BRACE. CB # [phone]

## 2016-11-26 NOTE — Telephone Encounter (Signed)
I called patient and spoke with her over the phone advising strap A would go first crossing over top of ankle wrapping underneath and up alternate side, then B. She states she cannot get the last strap to go. She has watched videos on the internet and is having complications. Advised her to come in office and I could see what she is speaking of in person and we could straighten this out for her. She will come after 1230 today. She does not need to see a provider.

## 2016-12-05 ENCOUNTER — Ambulatory Visit
Admission: RE | Admit: 2016-12-05 | Discharge: 2016-12-05 | Disposition: A | Discharge status: Home / Self Care | Payer: Medicare Other | Source: Ambulatory Visit | Attending: Internal Medicine | Admitting: Internal Medicine

## 2016-12-05 DIAGNOSIS — Z1231 Encounter for screening mammogram for malignant neoplasm of breast: Secondary | ICD-10-CM | POA: Diagnosis not present

## 2016-12-17 ENCOUNTER — Encounter (INDEPENDENT_AMBULATORY_CARE_PROVIDER_SITE_OTHER): Payer: Self-pay | Admitting: Orthopedic Surgery

## 2016-12-17 ENCOUNTER — Ambulatory Visit (INDEPENDENT_AMBULATORY_CARE_PROVIDER_SITE_OTHER): Payer: Medicare Other | Admitting: Orthopedic Surgery

## 2016-12-17 DIAGNOSIS — M76821 Posterior tibial tendinitis, right leg: Secondary | ICD-10-CM

## 2016-12-17 DIAGNOSIS — M6702 Short Achilles tendon (acquired), left ankle: Secondary | ICD-10-CM | POA: Diagnosis not present

## 2016-12-17 DIAGNOSIS — M722 Plantar fascial fibromatosis: Secondary | ICD-10-CM | POA: Diagnosis not present

## 2016-12-17 NOTE — Progress Notes (Signed)
Office Visit Note   Patient: Monique Wright           Date of Birth: [birth year]           MRN: [id number] Visit Date: 12/17/2016              Requested by: Burnard Bunting, MD [address] [place], [place] [address] PCP: Geoffery Lyons, MD  Chief Complaint  Patient presents with  . Right Foot - Follow-up    [id number] states she started have some pain over the posterior tibial tendon as well as laterally with some impingement. She is using [facility] patch over the posterior tibial tendon and states the plantar fascia and Achilles tendinitis has improved. HPI  Assessment & Plan: Visit Diagnoses:  1. Plantar fascial fibromatosis   2. Achilles tendon contracture, left   3. Posterior tibial tendinitis, right leg     Plan: A Hapads felt pad was placed to provide her better arch support. She'll continue the night was room patch continue the medical compression stocking for her venous insufficiency. Patient states she like to follow-up as needed.  Follow-Up Instructions: Return if symptoms worsen or fail to improve.   Ortho Exam On examination patient is alert oriented no adenopathy well-dressed normal affect normal respiratory effort she has good pulses she has venous stasis swelling but no ulcers no drainage. She is tender and has some swelling over the posterior tibial tendon she cannot do a single limb heel raise. The plantar fascia and Achilles tendon is nontender to palpation she does have some swelling and pain to palpation over the sinus Tarsi.  Imaging: No results found.  Orders:  No orders of the defined types were placed in this encounter.  No orders of the defined types were placed in this encounter.    Procedures: No procedures performed  Clinical Data: No additional findings.  Subjective: Review of Systems  Objective: Vital Signs: There were no vitals taken for this visit.  Specialty Comments:  No specialty comments available.  PMFS  History: Patient Active Problem List   Diagnosis Date Noted  . Posterior tibial tendinitis, right leg 12/17/2016  . Posterior tibial tendinitis, left leg 11/19/2016  . Plantar fascial fibromatosis 10/17/2016  . Achilles tendon contracture, left 10/17/2016  . Dyspnea 01/31/2011  . Asymptomatic PVCs 01/31/2011  . Diastolic dysfunction, left ventricle 01/31/2011  . Benign hypertensive heart disease without heart failure 01/31/2011  . Hypercholesterolemia 01/31/2011  . ANGIODYSPLASIA-INTESTINE 08/22/2010  . GASTRITIS 05/07/2010  . ANEMIA, IRON DEFICIENCY 04/16/2010  . FECAL OCCULT BLOOD 04/16/2010   Past Medical History:  Diagnosis Date  . Anemia   . Arthritis    spinal arthritis  . Coronary atherosclerosis of native coronary artery    pt denies having any heart problems and never seen the person who entered this in.  . Fatigue   . Hypertension   . Hypothyroidism   . Left ventricular systolic dysfunction   . Shortness of breath    on exertion    Family History  Problem Relation Age of Onset  . Heart attack Mother   . Lung cancer Father     Past Surgical History:  Procedure Laterality Date  . ABDOMINAL HYSTERECTOMY     tah and bso  . APPENDECTOMY    . ENTEROSCOPY N/A 12/21/2013   Procedure: ENTEROSCOPY;  Surgeon: Irene Shipper, MD;  Location: [facility];  Service: Endoscopy;  Laterality: N/A;  . TONSILECTOMY, ADENOIDECTOMY, BILATERAL MYRINGOTOMY AND TUBES    . torn cartlidge  repair Right yrs ago   Social History   Occupational History  . 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 Smoking status: Never Smoker  . Smokeless tobacco: Never Used  . Alcohol use Yes     Comment: occasional  . Drug use: No  . Sexual activity: Not on file

## 2017-01-09 DIAGNOSIS — H16103 Unspecified superficial keratitis, bilateral: Secondary | ICD-10-CM | POA: Diagnosis not present

## 2017-01-09 DIAGNOSIS — H04123 Dry eye syndrome of bilateral lacrimal glands: Secondary | ICD-10-CM | POA: Diagnosis not present

## 2017-01-09 DIAGNOSIS — Z961 Presence of intraocular lens: Secondary | ICD-10-CM | POA: Diagnosis not present

## 2017-01-09 DIAGNOSIS — H401232 Low-tension glaucoma, bilateral, moderate stage: Secondary | ICD-10-CM | POA: Diagnosis not present

## 2017-02-05 DIAGNOSIS — Z124 Encounter for screening for malignant neoplasm of cervix: Secondary | ICD-10-CM | POA: Diagnosis not present

## 2017-02-05 DIAGNOSIS — Z6826 Body mass index (BMI) 26.0-26.9, adult: Secondary | ICD-10-CM | POA: Diagnosis not present

## 2017-02-05 DIAGNOSIS — Z1272 Encounter for screening for malignant neoplasm of vagina: Secondary | ICD-10-CM | POA: Diagnosis not present

## 2017-02-06 DIAGNOSIS — E784 Other hyperlipidemia: Secondary | ICD-10-CM | POA: Diagnosis not present

## 2017-02-06 DIAGNOSIS — M859 Disorder of bone density and structure, unspecified: Secondary | ICD-10-CM | POA: Diagnosis not present

## 2017-02-06 DIAGNOSIS — I10 Essential (primary) hypertension: Secondary | ICD-10-CM | POA: Diagnosis not present

## 2017-02-06 DIAGNOSIS — E038 Other specified hypothyroidism: Secondary | ICD-10-CM | POA: Diagnosis not present

## 2017-02-11 DIAGNOSIS — Z1212 Encounter for screening for malignant neoplasm of rectum: Secondary | ICD-10-CM | POA: Diagnosis not present

## 2017-02-17 DIAGNOSIS — Z0000 Encounter for general adult medical examination without abnormal findings: Secondary | ICD-10-CM | POA: Diagnosis not present

## 2017-02-17 DIAGNOSIS — Q265 Anomalous portal venous connection: Secondary | ICD-10-CM | POA: Diagnosis not present

## 2017-02-17 DIAGNOSIS — E038 Other specified hypothyroidism: Secondary | ICD-10-CM | POA: Diagnosis not present

## 2017-02-17 DIAGNOSIS — R011 Cardiac murmur, unspecified: Secondary | ICD-10-CM | POA: Diagnosis not present

## 2017-02-17 DIAGNOSIS — I10 Essential (primary) hypertension: Secondary | ICD-10-CM | POA: Diagnosis not present

## 2017-02-17 DIAGNOSIS — Z6824 Body mass index (BMI) 24.0-24.9, adult: Secondary | ICD-10-CM | POA: Diagnosis not present

## 2017-02-17 DIAGNOSIS — M859 Disorder of bone density and structure, unspecified: Secondary | ICD-10-CM | POA: Diagnosis not present

## 2017-02-17 DIAGNOSIS — R8290 Unspecified abnormal findings in urine: Secondary | ICD-10-CM | POA: Diagnosis not present

## 2017-02-17 DIAGNOSIS — R5383 Other fatigue: Secondary | ICD-10-CM | POA: Diagnosis not present

## 2017-02-17 DIAGNOSIS — E784 Other hyperlipidemia: Secondary | ICD-10-CM | POA: Diagnosis not present

## 2017-02-17 DIAGNOSIS — R358 Other polyuria: Secondary | ICD-10-CM | POA: Diagnosis not present

## 2017-02-17 DIAGNOSIS — R0609 Other forms of dyspnea: Secondary | ICD-10-CM | POA: Diagnosis not present

## 2017-02-17 DIAGNOSIS — M79671 Pain in right foot: Secondary | ICD-10-CM | POA: Diagnosis not present

## 2017-02-18 ENCOUNTER — Other Ambulatory Visit: Payer: Self-pay

## 2017-02-18 ENCOUNTER — Ambulatory Visit (HOSPITAL_COMMUNITY): Payer: Medicare Other | Attending: Cardiovascular Disease

## 2017-02-18 ENCOUNTER — Other Ambulatory Visit (HOSPITAL_COMMUNITY): Payer: Self-pay | Admitting: Internal Medicine

## 2017-02-18 DIAGNOSIS — R011 Cardiac murmur, unspecified: Secondary | ICD-10-CM | POA: Insufficient documentation

## 2017-02-18 DIAGNOSIS — I351 Nonrheumatic aortic (valve) insufficiency: Secondary | ICD-10-CM | POA: Diagnosis not present

## 2017-02-18 DIAGNOSIS — I350 Nonrheumatic aortic (valve) stenosis: Secondary | ICD-10-CM | POA: Diagnosis not present

## 2017-02-18 LAB — ECHOCARDIOGRAM COMPLETE
AO mean calculated velocity dopler: 146 cm/s
AV Area mean vel: 2 cm2
AV area mean vel ind: 1.19 cm2/m2
AV peak Index: 1.18
AV vel: 1.95
AVA: 1.95 cm2
AVAREAVTI: 1.98 cm2
AVAREAVTIIND: 1.16 cm2/m2
AVCELMEANRAT: 0.48
AVG: 9 mmHg
AVPG: 15 mmHg
AVPHT: 580 ms
AVPKVEL: 191 cm/s
Ao pk vel: 0.48 m/s
Ao-asc: 33 cm
CHL CUP AV VALUE AREA INDEX: 1.16
CHL CUP DOP CALC LVOT VTI: 21.7 cm
CHL CUP MV DEC (S): 197
E decel time: 197 msec
EERAT: 8.4
FS: 28 % (ref 28–44)
IV/PV OW: 1.15
LA ID, A-P, ES: 29 mm
LA diam end sys: 29 mm
LA diam index: 1.73 cm/m2
LA vol A4C: 19.5 ml
LA vol index: 13.5 mL/m2
LAVOL: 22.7 mL
LDCA: 4.15 cm2
LV PW d: 7.97 mm — AB (ref 0.6–1.1)
LV TDI E'MEDIAL: 4.68
LV e' LATERAL: 7.62 cm/s
LVEEAVG: 8.4
LVEEMED: 8.4
LVOT peak VTI: 0.47 cm
LVOT peak vel: 90.9 cm/s
LVOTD: 23 mm
LVOTSV: 90 mL
MV pk A vel: 90.7 m/s
MVPKEVEL: 64 m/s
RV LATERAL S' VELOCITY: 10.8 cm/s
RV sys press: 20 mmHg
Reg peak vel: 205 cm/s
TAPSE: 19.1 mm
TDI e' lateral: 7.62
TRMAXVEL: 205 cm/s
VTI: 46.1 cm

## 2017-02-19 ENCOUNTER — Other Ambulatory Visit: Payer: Self-pay | Admitting: Internal Medicine

## 2017-02-19 DIAGNOSIS — R011 Cardiac murmur, unspecified: Secondary | ICD-10-CM

## 2017-03-11 ENCOUNTER — Encounter (INDEPENDENT_AMBULATORY_CARE_PROVIDER_SITE_OTHER): Payer: Self-pay | Admitting: Orthopedic Surgery

## 2017-03-11 ENCOUNTER — Ambulatory Visit (INDEPENDENT_AMBULATORY_CARE_PROVIDER_SITE_OTHER): Payer: Medicare Other | Admitting: Orthopedic Surgery

## 2017-03-11 VITALS — Ht 64.0 in | Wt 140.0 lb

## 2017-03-11 DIAGNOSIS — M7671 Peroneal tendinitis, right leg: Secondary | ICD-10-CM

## 2017-03-11 DIAGNOSIS — M6701 Short Achilles tendon (acquired), right ankle: Secondary | ICD-10-CM

## 2017-03-11 DIAGNOSIS — M76821 Posterior tibial tendinitis, right leg: Secondary | ICD-10-CM | POA: Diagnosis not present

## 2017-03-11 DIAGNOSIS — M722 Plantar fascial fibromatosis: Secondary | ICD-10-CM | POA: Diagnosis not present

## 2017-03-11 NOTE — Progress Notes (Signed)
Office Visit Note   Patient: Monique Wright           Date of Birth: 09/24/1936           MRN: [id number] Visit Date: 03/11/2017              Requested by: Burnard Bunting, [place], [place] [address] PCP: Burnard Bunting, MD  Chief Complaint  Patient presents with  . Right Foot - Follow-up      HPI: Patient presents in follow-up for posterior tibial tendon insufficiency some peroneal tendinitis plantar fasciitis and Achilles contracture. Patient did go to [employer] obtain some orthotics and stabilizing new balance sneakers which are helping.  Assessment & Plan: Visit Diagnoses:  1. Plantar fascial fibromatosis   2. Posterior tibial tendinitis, right leg   3. Short Achilles tendon (acquired), right ankle   4. Peroneal tendinitis, right leg     Plan: Recommend that she continue the orthotics and the new balance sneakers recommended the knee-high compression stockings to help with the venous insufficiency swelling and recommended continue heel cord stretching  Follow-Up Instructions: Return if symptoms worsen or fail to improve.   Ortho Exam  Patient is alert, oriented, no adenopathy, well-dressed, normal affect, normal respiratory effort. Examination patient has a good dorsalis pedis pulse she has some mild tenderness to palpation over the posterior tibial tendon as well as over the peroneal tendons. She has no ulcers no cellulitis. She still has some heel cord tightness but no pain with dorsiflexion of the foot the plantar fascia is nontender to palpation.  Imaging: No results found.  Labs: No results found for: HGBA1C, ESRSEDRATE, CRP, LABURIC, REPTSTATUS, GRAMSTAIN, CULT, LABORGA  Orders:  No orders of the defined types were placed in this encounter.  No orders of the defined types were placed in this encounter.    Procedures: No procedures performed  Clinical Data: No additional findings.  ROS:  All other systems negative, except as  noted in the HPI. Review of Systems  Objective: Vital Signs: Ht 5\' 4"  (1.626 m)   Wt 140 lb (63.5 kg)   BMI 24.03 kg/m   Specialty Comments:  No specialty comments available.  PMFS History: Patient Active Problem List   Diagnosis Date Noted  . Short Achilles tendon (acquired), right ankle 03/11/2017  . Peroneal tendinitis, right leg 03/11/2017  . Posterior tibial tendinitis, right leg 12/17/2016  . Posterior tibial tendinitis, left leg 11/19/2016  . Plantar fascial fibromatosis 10/17/2016  . Achilles tendon contracture, left 10/17/2016  . Dyspnea 01/31/2011  . Asymptomatic PVCs 01/31/2011  . Diastolic dysfunction, left ventricle 01/31/2011  . Benign hypertensive heart disease without heart failure 01/31/2011  . Hypercholesterolemia 01/31/2011  . ANGIODYSPLASIA-INTESTINE 08/22/2010  . GASTRITIS 05/07/2010  . ANEMIA, IRON DEFICIENCY 04/16/2010  . FECAL OCCULT BLOOD 04/16/2010   Past Medical History:  Diagnosis Date  . Anemia   . Arthritis    spinal arthritis  . Coronary atherosclerosis of native coronary artery    pt denies having any heart problems and never seen the person who entered this in.  . Fatigue   . Hypertension   . Hypothyroidism   . Left ventricular systolic dysfunction   . Shortness of breath    on exertion    Family History  Problem Relation Age of Onset  . Heart attack Mother   . Lung cancer Father     Past Surgical History:  Procedure Laterality Date  . ABDOMINAL HYSTERECTOMY     tah  and bso  . APPENDECTOMY    . ENTEROSCOPY N/A 12/21/2013   Procedure: ENTEROSCOPY;  Surgeon: Irene Shipper, MD;  Location: [facility];  Service: Endoscopy;  Laterality: N/A;  . TONSILECTOMY, ADENOIDECTOMY, BILATERAL MYRINGOTOMY AND TUBES    . torn cartlidge repair Right yrs ago   Social History   Occupational History  . 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 Smoking status: Never Smoker  . Smokeless tobacco: Never Used  . Alcohol  use Yes     Comment: occasional  . Drug use: No  . Sexual activity: Not on file

## 2017-03-24 DIAGNOSIS — R1310 Dysphagia, unspecified: Secondary | ICD-10-CM | POA: Diagnosis not present

## 2017-03-24 DIAGNOSIS — R0989 Other specified symptoms and signs involving the circulatory and respiratory systems: Secondary | ICD-10-CM | POA: Diagnosis not present

## 2017-03-24 DIAGNOSIS — Z6824 Body mass index (BMI) 24.0-24.9, adult: Secondary | ICD-10-CM | POA: Diagnosis not present

## 2017-03-28 ENCOUNTER — Encounter: Payer: Self-pay | Admitting: Internal Medicine

## 2017-03-28 ENCOUNTER — Ambulatory Visit (INDEPENDENT_AMBULATORY_CARE_PROVIDER_SITE_OTHER): Payer: Medicare Other | Admitting: Internal Medicine

## 2017-03-28 VITALS — BP 142/78 | HR 81 | Ht 63.0 in | Wt 136.0 lb

## 2017-03-28 DIAGNOSIS — R070 Pain in throat: Secondary | ICD-10-CM | POA: Diagnosis not present

## 2017-03-28 DIAGNOSIS — R1310 Dysphagia, unspecified: Secondary | ICD-10-CM | POA: Diagnosis not present

## 2017-03-28 DIAGNOSIS — R490 Dysphonia: Secondary | ICD-10-CM

## 2017-03-28 NOTE — Progress Notes (Signed)
HISTORY OF PRESENT ILLNESS:  Monique Wright is a 81 y.o. female with multiple medical problems as listed below who was last evaluated in this office February 2015 for her iron deficiency anemia and Hemoccult-positive stool. See that dictation for details. As part of her workup she subsequently underwent EGD with small bowel enteroscopy in March 2015. Jejunal AVMs were ablated. The patient has had no problems with anemia since. She is sent today by Dr. Reynaldo Minium with a new chief complaint of difficulty swallowing. Also reports problems with coughing spells, throat discomfort, and hoarseness. First, the patient reports at least a six-month history of intermittent coughing while eating. Seemingly the food "goes down the wrong pipe". She is also has some issues with hoarseness. A little over 2 weeks ago she describes an episode of acute meat impaction which required vomiting for relief. She points to the cervical esophagus. She denies any dysphagia at all prior or since. No reflux symptoms. She was seen in her PCPs office and placed on PPI and this consultation arranged. Her problems with hoarseness persists. She now describes some throat discomfort as well. No weight loss. No history of smoking. No abdominal pain.  REVIEW OF SYSTEMS:  All non-GI ROS negative unless otherwise stated in the history of present illness except for anxiety, arthritis, back pain, heart murmur,  Past Medical History:  Diagnosis Date  . Anemia   . Arthritis    spinal arthritis  . Coronary atherosclerosis of native coronary artery    pt denies having any heart problems and never seen the person who entered this in.  . Fatigue   . Hypertension   . Hypothyroidism   . Left ventricular systolic dysfunction   . Plantar fasciitis   . Shortness of breath    on exertion    Past Surgical History:  Procedure Laterality Date  . ABDOMINAL HYSTERECTOMY     tah and bso  . APPENDECTOMY    . ENTEROSCOPY N/A 12/21/2013   Procedure:  ENTEROSCOPY;  Surgeon: Irene Shipper, MD;  Location: [facility];  Service: Endoscopy;  Laterality: N/A;  . TONSILECTOMY, ADENOIDECTOMY, BILATERAL MYRINGOTOMY AND TUBES    . torn cartlidge repair Right yrs ago    Social History Monique Wright  reports that she has never smoked. She has never used smokeless tobacco. She reports that she drinks alcohol. She reports that she does not use drugs.  family history includes Heart attack in her mother; Lung cancer in her father.  Allergies  Allergen Reactions  . Penicillins Hives  . Pneumococcal Vaccines Swelling       PHYSICAL EXAMINATION: Vital signs: BP (!) 142/78   Pulse 81   Ht 5\' 3"  (1.6 m)   Wt 136 lb (61.7 kg)   BMI 24.09 kg/m   Constitutional: generally well-appearing, no acute distress Psychiatric: alert and oriented x3, cooperative Eyes: extraocular movements intact, anicteric, conjunctiva pink Mouth: oral pharynx moist, no lesions. No obvious abnormalities in the posterior pharynx Neck: supple no lymphadenopathy Cardiovascular: heart regular rate and rhythm, no murmur Lungs: clear to auscultation bilaterally Abdomen: soft, nontender, nondistended, no obvious ascites, no peritoneal signs, normal bowel sounds, no organomegaly Rectal:Omitted Extremities: no clubbing cyanosis or lower extremity edema bilaterally Skin: no lesions on visible extremities Neuro: No focal deficits. Cranial nerves intact  ASSESSMENT:  #1. Dysphagia with an episode of what sounds like acute cervical esophageal dysphagia. Question cricopharyngeal bar or proximal with particularly given intermittent problems with eating related "choking spells" #2. New persistent complaints of mild hoarseness  with some throat discomfort. Needs formal ENT evaluation #3. History of GI AVMs successfully treated with endoscopic ablation  #4. Last complete colonoscopy July 2011 with diminutive polyp only.    PLAN:  1. Recommend barium esophagogram with tablet.  Scheduled. This to evaluate the esophageal anatomy 2. Schedule upper endoscopy with possible esophageal dilation.The nature of the procedure, as well as the risks, benefits, and alternatives were carefully and thoroughly reviewed with the patient. Ample time for discussion and questions allowed. The patient understood, was satisfied, and agreed to proceed. 3. Recommend ENT evaluation for ENT complaints. We have assisted with that referral 4. Ongoing general medical care with Dr. Reynaldo Minium 5. Continue PPI for now A copy of this consultation note was sent to Dr. Reynaldo Minium

## 2017-03-28 NOTE — Patient Instructions (Signed)
You have been scheduled for a Barium Esophogram at [facility] (1st floor of the hospital) on 04/02/2017 at 9:30am. Please arrive 15 minutes prior to your appointment for registration. Make certain not to have anything to eat or drink 6 hours prior to your test. If you need to reschedule for any reason, please contact radiology at [phone] to do so. __________________________________________________________________ A barium swallow is an examination that concentrates on views of the esophagus. This tends to be a double contrast exam (barium and two liquids which, when combined, create a gas to distend the wall of the oesophagus) or single contrast (non-ionic iodine based). The study is usually tailored to your symptoms so a good history is essential. Attention is paid during the study to the form, structure and configuration of the esophagus, looking for functional disorders (such as aspiration, dysphagia, achalasia, motility and reflux) EXAMINATION You may be asked to change into a gown, depending on the type of swallow being performed. A radiologist and radiographer will perform the procedure. The radiologist will advise you of the type of contrast selected for your procedure and direct you during the exam. You will be asked to stand, sit or lie in several different positions and to hold a small amount of fluid in your mouth before being asked to swallow while the imaging is performed .In some instances you may be asked to swallow barium coated marshmallows to assess the motility of a solid food bolus. The exam can be recorded as a digital or video fluoroscopy procedure. POST PROCEDURE It will take 1-2 days for the barium to pass through your system. To facilitate this, it is important, unless otherwise directed, to increase your fluids for the next 24-48hrs and to resume your normal diet.  This test typically takes about 30 minutes to  perform. __________________________________________________________________________________  Monique Wright have been scheduled for an endoscopy. Please follow written instructions given to you at your visit today. If you use inhalers (even only as needed), please bring them with you on the day of your procedure. Your physician has requested that you go to www.startemmi.com and enter the access code given to you at your visit today. This web site gives a general overview about your procedure. However, you should still follow specific instructions given to you by our office regarding your preparation for the procedure.

## 2017-04-02 ENCOUNTER — Ambulatory Visit (HOSPITAL_COMMUNITY)
Admission: RE | Admit: 2017-04-02 | Discharge: 2017-04-02 | Disposition: A | Discharge status: Home / Self Care | Payer: Medicare Other | Source: Ambulatory Visit | Attending: Internal Medicine | Admitting: Internal Medicine

## 2017-04-02 DIAGNOSIS — K449 Diaphragmatic hernia without obstruction or gangrene: Secondary | ICD-10-CM | POA: Diagnosis not present

## 2017-04-02 DIAGNOSIS — R070 Pain in throat: Secondary | ICD-10-CM | POA: Insufficient documentation

## 2017-04-02 DIAGNOSIS — R490 Dysphonia: Secondary | ICD-10-CM | POA: Insufficient documentation

## 2017-04-02 DIAGNOSIS — R1310 Dysphagia, unspecified: Secondary | ICD-10-CM | POA: Insufficient documentation

## 2017-04-02 DIAGNOSIS — R05 Cough: Secondary | ICD-10-CM | POA: Diagnosis not present

## 2017-04-25 ENCOUNTER — Encounter: Payer: Self-pay | Admitting: Internal Medicine

## 2017-04-25 ENCOUNTER — Ambulatory Visit (AMBULATORY_SURGERY_CENTER): Payer: Medicare Other | Admitting: Internal Medicine

## 2017-04-25 VITALS — BP 145/71 | HR 60 | Temp 97.8°F | Resp 16 | Ht 63.0 in | Wt 136.0 lb

## 2017-04-25 DIAGNOSIS — R1310 Dysphagia, unspecified: Secondary | ICD-10-CM

## 2017-04-25 DIAGNOSIS — K2100 Gastro-esophageal reflux disease with esophagitis, without bleeding: Secondary | ICD-10-CM

## 2017-04-25 DIAGNOSIS — K253 Acute gastric ulcer without hemorrhage or perforation: Secondary | ICD-10-CM | POA: Diagnosis not present

## 2017-04-25 DIAGNOSIS — R1319 Other dysphagia: Secondary | ICD-10-CM

## 2017-04-25 MED ORDER — SODIUM CHLORIDE 0.9 % IV SOLN
500.0000 mL | INTRAVENOUS | Status: DC
Start: 2017-04-25 — End: 2018-07-17

## 2017-04-25 MED ORDER — OMEPRAZOLE 40 MG PO CPDR: DELAYED_RELEASE_CAPSULE | ORAL | 6 refills | Status: DC

## 2017-04-25 NOTE — Progress Notes (Signed)
Called to room to assist during endoscopic procedure.  Patient ID and intended procedure confirmed with present staff. Received instructions for my participation in the procedure from the performing physician.  

## 2017-04-25 NOTE — Progress Notes (Signed)
No problems noted in the recovery room. maw 

## 2017-04-25 NOTE — Patient Instructions (Signed)
YOU HAD AN ENDOSCOPIC PROCEDURE TODAY AT [facility]:   Refer to the procedure report that was given to you for any specific questions about what was found during the examination.  If the procedure report does not answer your questions, please call your gastroenterologist to clarify.  If you requested that your care partner not be given the details of your procedure findings, then the procedure report has been included in a sealed envelope for you to review at your convenience later.  YOU SHOULD EXPECT: Some feelings of bloating in the abdomen. Passage of more gas than usual.  Walking can help get rid of the air that was put into your GI tract during the procedure and reduce the bloating. If you had a lower endoscopy (such as a colonoscopy or flexible sigmoidoscopy) you may notice spotting of blood in your stool or on the toilet paper. If you underwent a bowel prep for your procedure, you may not have a normal bowel movement for a few days.  Please Note:  You might notice some irritation and congestion in your nose or some drainage.  This is from the oxygen used during your procedure.  There is no need for concern and it should clear up in a day or so.  SYMPTOMS TO REPORT IMMEDIATELY:    Following upper endoscopy (EGD)  Vomiting of blood or coffee ground material  New chest pain or pain under the shoulder blades  Painful or persistently difficult swallowing  New shortness of breath  Fever of 100F or higher  Black, tarry-looking stools  For urgent or emergent issues, a gastroenterologist can be reached at any hour by calling [phone].   DIET:  We do recommend a small meal at first, but then you may proceed to your regular diet.  Drink plenty of fluids but you should avoid alcoholic beverages for 24 hours.  ACTIVITY:  You should plan to take it easy for the rest of today and you should NOT DRIVE or use heavy machinery until tomorrow (because of the sedation medicines used  during the test).    FOLLOW UP: Our staff will call the number listed on your records the next business day following your procedure to check on you and address any questions or concerns that you may have regarding the information given to you following your procedure. If we do not reach you, we will leave a message.  However, if you are feeling well and you are not experiencing any problems, there is no need to return our call.  We will assume that you have returned to your regular daily activities without incident.  If any biopsies were taken you will be contacted by phone or by letter within the next 1-3 weeks.  Please call us at [phone] if you have not heard about the biopsies in 3 weeks.    SIGNATURES/CONFIDENTIALITY: You and/or your care partner have signed paperwork which will be entered into your electronic medical record.  These signatures attest to the fact that that the information above on your After Visit Summary has been reviewed and is understood.  Full responsibility of the confidentiality of this discharge information lies with you and/or your care-partner.    OMEPRAZOLE 40 mg by mouth twice a day was sent to your pharmacy. Arrange ENT consultation with Dr. Melissa Montane.  Dr. Blanch Media nurse will call you with this appoittment. Follow up With Dr. [name] in 6 weeks.  Nurse will call you with an appointment. You may  resume your current medications today. Await biopsy results. Please call if any questions or concerns.

## 2017-04-25 NOTE — Op Note (Signed)
Monique Wright: Monique Wright Procedure Date: 04/25/2017 11:31 AM MRN: [id number] Endoscopist: Docia Chuck. [name] , MD Age: 81 Referring MD:  Date of Birth: [birth year] Gender: Female Account #: [id number] Procedure:                Upper GI endoscopy, with biopsies Indications:              Dysphagia, pharyngeal complaints Medicines:                Monitored Anesthesia Care Procedure:                Pre-Anesthesia Assessment:                           - Prior to the procedure, a History and Physical                            was performed, and patient medications and                            allergies were reviewed. The patient's tolerance of                            previous anesthesia was also reviewed. The risks                            and benefits of the procedure and the sedation                            options and risks were discussed with the patient.                            All questions were answered, and informed consent                            was obtained. Prior Anticoagulants: The patient has                            taken no previous anticoagulant or antiplatelet                            agents. ASA Grade Assessment: II - A patient with                            mild systemic disease. After reviewing the risks                            and benefits, the patient was deemed in                            satisfactory condition to undergo the procedure.                           After obtaining informed consent, the endoscope was  passed under direct vision. Throughout the                            procedure, the patient's blood pressure, pulse, and                            oxygen saturations were monitored continuously. The                            Endoscope was introduced through the mouth, and                            advanced to the second part of duodenum. The upper                            GI  endoscopy was accomplished without difficulty.                            The patient tolerated the procedure well. Scope In: Scope Out: Findings:                 The posterior pharynx was normal. See image.                           LA Grade A (one or more mucosal breaks less than 5                            mm, not extending between tops of 2 mucosal folds)                            esophagitis was found. The esophagus was otherwise                            normal.                           The stomach revealed multiple small erosions in the                            antrum and distal body. Hematin present. The                            stomach was otherwise normal                           The examined duodenum was normal.                           The cardia and gastric fundus were normal on                            retroflexion.                           Biopsies were taken with a cold forceps for  Helicobacter pylori testing using CLOtest. Complications:            No immediate complications. Estimated Blood Loss:     Estimated blood loss: none. Impression:               - Normal posterior pharynx.                           - LA Grade A reflux esophagitis.                           - Normal examined duodenum.                           - Erosive gastropathy. Biopsied. Recommendation:           1. Prescribe omeprazole 40 mg by mouth twice a day;                            #60; 6 refills                           2. Arrange ENT consultation with Dr. Melissa Montane                            "pharyngeal complaints and discomfort, please                            evaluate".                           3. Office follow-up with Dr. [name] in 6 weeks.                           4. Await pathology results. Docia Chuck. [name], MD 04/25/2017 11:53:28 AM This report has been signed electronically.

## 2017-04-25 NOTE — Progress Notes (Signed)
Dental advisory given to patientAlert and oriented x3, pleased with MAC, report to RN  

## 2017-04-28 ENCOUNTER — Telehealth: Payer: Self-pay

## 2017-04-28 LAB — HELICOBACTER PYLORI SCREEN-BIOPSY: UREASE: NEGATIVE

## 2017-04-28 NOTE — Telephone Encounter (Signed)
  Follow up Call-  Call back number 04/25/2017  Post procedure Call Back phone  # [phone]  Permission to leave phone message Yes  Some recent data might be hidden     Patient questions:  Do you have a fever, pain , or abdominal swelling? No. Pain Score  0 *  Have you tolerated food without any problems? Yes.    Have you been able to return to your normal activities? Yes.    Do you have any questions about your discharge instructions: Diet   No. Medications  No. Follow up visit  No.  Do you have questions or concerns about your Care? Yes.    Actions: * If pain score is 4 or above: No action needed, pain <4.  Pt reported she felt "slugish for couple of days" after EGD.  She asked when she would get results of biopsies and I told her around 2 weeks.  To call us back if she has not heard by 3 weeks. maw

## 2017-04-29 ENCOUNTER — Telehealth: Payer: Self-pay

## 2017-04-29 NOTE — Telephone Encounter (Signed)
Pt scheduled to see Dr. [name] for follow-up 06/11/17@2 :15pm. Pt scheduled to see Dr. Melissa Montane ENT for pharyngeal complaints and discomfort 05/28/17@8 :30am, pt to arrive at 8:15am. Office location [address]. Records faxed to [phone].  Spoke with pt and she states she will be out of town on 05/28/17, pt given the phone number [phone] to call and reschedule the appt with Dr. Janace Hoard to a date that works for her. Pt aware of OV appt with Dr. [name].

## 2017-05-19 ENCOUNTER — Encounter (INDEPENDENT_AMBULATORY_CARE_PROVIDER_SITE_OTHER): Payer: Self-pay | Admitting: Orthopedic Surgery

## 2017-05-19 ENCOUNTER — Ambulatory Visit (INDEPENDENT_AMBULATORY_CARE_PROVIDER_SITE_OTHER): Payer: Medicare Other | Admitting: Orthopedic Surgery

## 2017-05-19 DIAGNOSIS — M76821 Posterior tibial tendinitis, right leg: Secondary | ICD-10-CM

## 2017-05-19 MED ORDER — CELECOXIB 200 MG PO CAPS: 200.0000 mg | ORAL_CAPSULE | Freq: Two times a day (BID) | ORAL | 3 refills | Status: DC | PRN

## 2017-05-19 NOTE — Progress Notes (Signed)
Office Visit Note   Patient: Monique Wright           Date of Birth: January 26, 1936           MRN: [id number] Visit Date: 05/19/2017              Requested by: Burnard Bunting, MD [address] [place], [place] [address] PCP: Burnard Bunting, MD  Chief Complaint  Patient presents with  . Right Foot - Follow-up  . Right Ankle - Follow-up      HPI: Patient presents with occasional pain and swelling over the posterior tibial tendon on the right she also complains of some venous stasis swelling as well.  Assessment & Plan: Visit Diagnoses:  1. Posterior tibial tendinitis, right leg     Plan: Recommended continue with the over-the-counter orthotics recommended that she wear her medical compression stockings. Discussed that if her symptoms worsen we could consider the posterior tibial tendon brace. Patient request to try Celebrex as opposed to her Relafen to see if this works better. Prescription is called in.  Follow-Up Instructions: Return if symptoms worsen or fail to improve.   Ortho Exam  Patient is alert, oriented, no adenopathy, well-dressed, normal affect, normal respiratory effort. Examination patient does have some brawny skin color changes from venous insufficiency with mild pitting edema. There is no swelling in the foot or ankle she has good ankle good subtalar motion good inversion strength. She has good pulses. She has no tenderness to palpation today over the posterior tibial tendon. However this is where she normally has her swelling.  Imaging: No results found.  Labs: No results found for: HGBA1C, ESRSEDRATE, CRP, LABURIC, REPTSTATUS, GRAMSTAIN, CULT, LABORGA  Orders:  No orders of the defined types were placed in this encounter.  Meds ordered this encounter  Medications  . celecoxib (CELEBREX) 200 MG capsule    Sig: Take 1 capsule (200 mg total) by mouth 2 (two) times daily as needed.    Dispense:  60 capsule    Refill:  3     Procedures: No  procedures performed  Clinical Data: No additional findings.  ROS:  All other systems negative, except as noted in the HPI. Review of Systems  Objective: Vital Signs: There were no vitals taken for this visit.  Specialty Comments:  No specialty comments available.  PMFS History: Patient Active Problem List   Diagnosis Date Noted  . Short Achilles tendon (acquired), right ankle 03/11/2017  . Peroneal tendinitis, right leg 03/11/2017  . Posterior tibial tendinitis, right leg 12/17/2016  . Posterior tibial tendinitis, left leg 11/19/2016  . Plantar fascial fibromatosis 10/17/2016  . Achilles tendon contracture, left 10/17/2016  . Dyspnea 01/31/2011  . Asymptomatic PVCs 01/31/2011  . Diastolic dysfunction, left ventricle 01/31/2011  . Benign hypertensive heart disease without heart failure 01/31/2011  . Hypercholesterolemia 01/31/2011  . ANGIODYSPLASIA-INTESTINE 08/22/2010  . GASTRITIS 05/07/2010  . ANEMIA, IRON DEFICIENCY 04/16/2010  . FECAL OCCULT BLOOD 04/16/2010   Past Medical History:  Diagnosis Date  . Anemia   . Arthritis    spinal arthritis  . Coronary atherosclerosis of native coronary artery    pt denies having any heart problems and never seen the person who entered this in.  . Fatigue   . Hypertension   . Hypothyroidism   . Left ventricular systolic dysfunction   . Plantar fasciitis   . Shortness of breath    on exertion    Family History  Problem Relation Age of Onset  . Heart  attack Mother   . Lung cancer Father   . Stomach cancer Neg Hx   . Colon cancer Neg Hx     Past Surgical History:  Procedure Laterality Date  . ABDOMINAL HYSTERECTOMY     tah and bso  . APPENDECTOMY    . ENTEROSCOPY N/A 12/21/2013   Procedure: ENTEROSCOPY;  Surgeon: Irene Shipper, MD;  Location: [facility];  Service: Endoscopy;  Laterality: N/A;  . TONSILECTOMY, ADENOIDECTOMY, BILATERAL MYRINGOTOMY AND TUBES    . torn cartlidge repair Right yrs ago   Social History    Occupational History  . 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 Smoking status: Never Smoker  . Smokeless tobacco: Never Used  . Alcohol use Yes     Comment: occasional  . Drug use: No  . Sexual activity: No

## 2017-06-10 DIAGNOSIS — R221 Localized swelling, mass and lump, neck: Secondary | ICD-10-CM | POA: Diagnosis not present

## 2017-06-10 DIAGNOSIS — R1314 Dysphagia, pharyngoesophageal phase: Secondary | ICD-10-CM | POA: Diagnosis not present

## 2017-06-10 DIAGNOSIS — K449 Diaphragmatic hernia without obstruction or gangrene: Secondary | ICD-10-CM | POA: Diagnosis not present

## 2017-06-10 DIAGNOSIS — K219 Gastro-esophageal reflux disease without esophagitis: Secondary | ICD-10-CM | POA: Diagnosis not present

## 2017-06-11 ENCOUNTER — Other Ambulatory Visit: Payer: Self-pay | Admitting: Otolaryngology

## 2017-06-11 ENCOUNTER — Encounter: Payer: Self-pay | Admitting: Internal Medicine

## 2017-06-11 ENCOUNTER — Ambulatory Visit (INDEPENDENT_AMBULATORY_CARE_PROVIDER_SITE_OTHER): Payer: Medicare Other | Admitting: Internal Medicine

## 2017-06-11 VITALS — BP 110/72 | HR 82 | Ht 63.0 in | Wt 137.0 lb

## 2017-06-11 DIAGNOSIS — K2100 Gastro-esophageal reflux disease with esophagitis, without bleeding: Secondary | ICD-10-CM

## 2017-06-11 DIAGNOSIS — R1314 Dysphagia, pharyngoesophageal phase: Secondary | ICD-10-CM

## 2017-06-11 DIAGNOSIS — R221 Localized swelling, mass and lump, neck: Secondary | ICD-10-CM

## 2017-06-11 DIAGNOSIS — R490 Dysphonia: Secondary | ICD-10-CM

## 2017-06-11 NOTE — Patient Instructions (Signed)
If you are age 79 or older, your body mass index should be between 23-30. Your Body mass index is 24.27 kg/m. If this is out of the aforementioned range listed, please consider follow up with your Primary Care Provider.  If you are age 77 or younger, your body mass index should be between 19-25. Your Body mass index is 24.27 kg/m. If this is out of the aformentioned range listed, please consider follow up with your Primary Care Provider.   Please continue your Omeprazole twice daily.  Start Metamucil daily.  Wear protective under garments.  Please follow up with Dr.Perry in 3 months.

## 2017-06-11 NOTE — Progress Notes (Signed)
HISTORY OF PRESENT ILLNESS:  Monique Wright is a 81 y.o. female who I saw 03/28/2017 regarding dysphagia, new complaints of force this with throat discomfort as well. To that dictation. Barium esophagram was unremarkable. Upper endoscopy revealed esophagitis and gastritis. Testing for H. pylori was negative. She was placed on omeprazole 40 mg twice daily. ENT evaluation arranged. Follow-up at this time. The patient tells me that on omeprazole twice daily her burning and coughing has improved. She continues with discomfort upon swallowing. She did see Dr. Janace Hoard yesterday and underwent laryngoscopy which was negative. He has rescheduled for neck CT. She is tolerating her medications well. No new GI complaints.   REVIEW OF SYSTEMS:  All non-GI ROS negative except for sinus and allergy, headaches, nosebleeds  Past Medical History:  Diagnosis Date  . Anemia   . Arthritis    spinal arthritis  . Coronary atherosclerosis of native coronary artery    pt denies having any heart problems and never seen the person who entered this in.  . Fatigue   . Hypertension   . Hypothyroidism   . Left ventricular systolic dysfunction   . Plantar fasciitis   . Shortness of breath    on exertion    Past Surgical History:  Procedure Laterality Date  . ABDOMINAL HYSTERECTOMY     tah and bso  . APPENDECTOMY    . ENTEROSCOPY N/A 12/21/2013   Procedure: ENTEROSCOPY;  Surgeon: Irene Shipper, MD;  Location: [facility];  Service: Endoscopy;  Laterality: N/A;  . TONSILECTOMY, ADENOIDECTOMY, BILATERAL MYRINGOTOMY AND TUBES    . torn cartlidge repair Right yrs ago    Social History Monique Wright  reports that she has never smoked. She has never used smokeless tobacco. She reports that she drinks alcohol. She reports that she does not use drugs.  family history includes Heart attack in her mother; Lung cancer in her father.  Allergies  Allergen Reactions  . Penicillins Hives  . Pneumococcal Vaccines  Swelling       PHYSICAL EXAMINATION: Vital signs: BP 110/72   Pulse 82   Ht 5\' 3"  (1.6 m)   Wt 137 lb (62.1 kg)   BMI 24.27 kg/m   Constitutional: generally well-appearing, no acute distress Psychiatric: alert and oriented x3, cooperative Eyes: extraocular movements intact, anicteric, conjunctiva pink Mouth: oral pharynx moist, no lesions Neck: supple no lymphadenopathy Cardiovascular: heart regular rate and rhythm, no murmur Lungs: clear to auscultation bilaterally Abdomen: soft, nontender, nondistended, no obvious ascites, no peritoneal signs, normal bowel sounds, no organomegaly Rectal:Omitted Extremities: no clubbing cyanosis or lower extremity edema bilaterally Skin: no lesions on visible extremities Neuro: No focal deficits.   ASSESSMENT:  #1. GERD. Mild esophagitis. Currently on omeprazole 40 mg twice daily. Symptoms of burning and cough have improved. #2. Pain was swallowing. Being evaluated by Dr. Janace Hoard #3. History of GI AVMs successfully treated with endoscopic ablation #4. Last complete colonoscopy 2011 with diminutive polyp only   PLAN:  #1. Reflux precautions #2. Continue pantoprazole 40 mg twice daily #3. Complete ENT evaluation with Dr. Janace Hoard #4. GI follow-up in 6 months. Contact the office in the interim for questions or problems  15 minutes spent face-to-face with the patient. Greater than 50% a time use for counseling regarding her GERD, its management, and follow-up

## 2017-06-16 ENCOUNTER — Ambulatory Visit
Admission: RE | Admit: 2017-06-16 | Discharge: 2017-06-16 | Disposition: A | Discharge status: Home / Self Care | Payer: Medicare Other | Source: Ambulatory Visit | Attending: Otolaryngology | Admitting: Otolaryngology

## 2017-06-16 DIAGNOSIS — R221 Localized swelling, mass and lump, neck: Secondary | ICD-10-CM

## 2017-06-16 DIAGNOSIS — R1314 Dysphagia, pharyngoesophageal phase: Secondary | ICD-10-CM

## 2017-06-16 DIAGNOSIS — M542 Cervicalgia: Secondary | ICD-10-CM | POA: Diagnosis not present

## 2017-06-16 MED ORDER — IOPAMIDOL (ISOVUE-300) INJECTION 61%
75.0000 mL | Freq: Once | INTRAVENOUS | Status: AC | PRN
  Administered 2017-06-16: 75 mL via INTRAVENOUS

## 2017-07-03 DIAGNOSIS — R1314 Dysphagia, pharyngoesophageal phase: Secondary | ICD-10-CM | POA: Diagnosis not present

## 2017-07-04 ENCOUNTER — Other Ambulatory Visit (HOSPITAL_COMMUNITY): Payer: Self-pay | Admitting: Otolaryngology

## 2017-07-04 DIAGNOSIS — R1319 Other dysphagia: Secondary | ICD-10-CM

## 2017-07-14 ENCOUNTER — Ambulatory Visit (HOSPITAL_COMMUNITY)
Admission: RE | Admit: 2017-07-14 | Discharge: 2017-07-14 | Disposition: A | Discharge status: Home / Self Care | Payer: Medicare Other | Source: Ambulatory Visit | Attending: Otolaryngology | Admitting: Otolaryngology

## 2017-07-14 DIAGNOSIS — Z23 Encounter for immunization: Secondary | ICD-10-CM | POA: Diagnosis not present

## 2017-07-14 DIAGNOSIS — R1310 Dysphagia, unspecified: Secondary | ICD-10-CM | POA: Diagnosis not present

## 2017-07-14 DIAGNOSIS — R1319 Other dysphagia: Secondary | ICD-10-CM

## 2017-07-28 DIAGNOSIS — H26491 Other secondary cataract, right eye: Secondary | ICD-10-CM | POA: Diagnosis not present

## 2017-07-28 DIAGNOSIS — H04123 Dry eye syndrome of bilateral lacrimal glands: Secondary | ICD-10-CM | POA: Diagnosis not present

## 2017-07-28 DIAGNOSIS — H401232 Low-tension glaucoma, bilateral, moderate stage: Secondary | ICD-10-CM | POA: Diagnosis not present

## 2017-07-28 DIAGNOSIS — H5340 Unspecified visual field defects: Secondary | ICD-10-CM | POA: Diagnosis not present

## 2017-08-15 DIAGNOSIS — M859 Disorder of bone density and structure, unspecified: Secondary | ICD-10-CM | POA: Diagnosis not present

## 2017-08-15 DIAGNOSIS — M722 Plantar fascial fibromatosis: Secondary | ICD-10-CM | POA: Diagnosis not present

## 2017-08-15 DIAGNOSIS — I350 Nonrheumatic aortic (valve) stenosis: Secondary | ICD-10-CM | POA: Diagnosis not present

## 2017-08-15 DIAGNOSIS — Q265 Anomalous portal venous connection: Secondary | ICD-10-CM | POA: Diagnosis not present

## 2017-08-15 DIAGNOSIS — E038 Other specified hypothyroidism: Secondary | ICD-10-CM | POA: Diagnosis not present

## 2017-08-15 DIAGNOSIS — K219 Gastro-esophageal reflux disease without esophagitis: Secondary | ICD-10-CM | POA: Diagnosis not present

## 2017-08-15 DIAGNOSIS — I10 Essential (primary) hypertension: Secondary | ICD-10-CM | POA: Diagnosis not present

## 2017-08-15 DIAGNOSIS — Z6824 Body mass index (BMI) 24.0-24.9, adult: Secondary | ICD-10-CM | POA: Diagnosis not present

## 2017-08-15 DIAGNOSIS — R011 Cardiac murmur, unspecified: Secondary | ICD-10-CM | POA: Diagnosis not present

## 2017-09-10 ENCOUNTER — Encounter: Payer: Self-pay | Admitting: Internal Medicine

## 2017-09-10 ENCOUNTER — Ambulatory Visit (INDEPENDENT_AMBULATORY_CARE_PROVIDER_SITE_OTHER): Payer: Medicare Other | Admitting: Internal Medicine

## 2017-09-10 VITALS — BP 106/72 | HR 74 | Ht 63.0 in | Wt 135.0 lb

## 2017-09-10 DIAGNOSIS — R159 Full incontinence of feces: Secondary | ICD-10-CM

## 2017-09-10 DIAGNOSIS — K219 Gastro-esophageal reflux disease without esophagitis: Secondary | ICD-10-CM | POA: Diagnosis not present

## 2017-09-10 NOTE — Progress Notes (Signed)
HISTORY OF PRESENT ILLNESS:  Monique Wright is a 81 y.o. female who was evaluated recently with complaints of GERD, pain with swallowing, and incontinence. She has undergone GI workup including endoscopy swallowing studies and ENT evaluation. She presents today for follow-up. ENT evaluation and swallowing evaluations were negative. Her PPI was increased to twice daily. She has resumed once daily dosage. She is happy to report that her reflux and pharyngeal complaints have significantly improved. Her problems with incontinence have responded nicely to regular fiber supplementation. No new problems or complaints  REVIEW OF SYSTEMS:  All non-GI ROS negative except for arthritis, back pain, cough  Past Medical History:  Diagnosis Date  . Anemia   . Arthritis    spinal arthritis  . Coronary atherosclerosis of native coronary artery    pt denies having any heart problems and never seen the person who entered this in.  . Fatigue   . Hypertension   . Hypothyroidism   . Left ventricular systolic dysfunction   . Plantar fasciitis   . Shortness of breath    on exertion    Past Surgical History:  Procedure Laterality Date  . ABDOMINAL HYSTERECTOMY     tah and bso  . APPENDECTOMY    . ENTEROSCOPY N/A 12/21/2013   Procedure: ENTEROSCOPY;  Surgeon: Irene Shipper, MD;  Location: [facility];  Service: Endoscopy;  Laterality: N/A;  . TONSILECTOMY, ADENOIDECTOMY, BILATERAL MYRINGOTOMY AND TUBES    . torn cartlidge repair Right yrs ago    Social History Monique Wright  reports that  has never smoked. she has never used smokeless tobacco. She reports that she drinks alcohol. She reports that she does not use drugs.  family history includes Heart attack in her mother; Lung cancer in her father.  Allergies  Allergen Reactions  . Penicillins Hives  . Pneumococcal Vaccines Swelling       PHYSICAL EXAMINATION:  Vital signs: BP 106/72   Pulse 74   Ht 5\' 3"  (1.6 m)   Wt 135 lb (61.2 kg)    BMI 23.91 kg/m  General: Well-developed, well-nourished, no acute distress HEENT: Sclerae are anicteric, conjunctiva pink. Oral mucosa intact Lungs: Clear Heart: Regular Abdomen: soft, nontender, nondistended, no obvious ascites, no peritoneal signs, normal bowel sounds. No organomegaly. Extremities: No clubbing, cyanosis, or edema Psychiatric: alert and oriented x3. Cooperative   ASSESSMENT:  #1. GERD. Classic symptoms responding to increased dose of PPI #2. Pharyngeal complaints. Negative ENT evaluation #3. Fecal incontinence. Responding to fiber supplementation #4. Last EGD July 2018. Last colonoscopy July 2011 with no follow-up recommended   PLAN:  #1. Reflux precautions #2. Continue PPI. Lowest dose to control symptoms #3. Continue fiber supplementation #4. Routine office follow-up one year. Sooner if needed  15 minutes spent face-to-face with the patient. Greater than 50% a time use for counseling regarding her GERD with esophagitis and incontinence issues

## 2017-09-10 NOTE — Patient Instructions (Signed)
Please follow up in one year 

## 2017-10-28 ENCOUNTER — Other Ambulatory Visit: Payer: Self-pay | Admitting: Internal Medicine

## 2017-10-28 DIAGNOSIS — Z1231 Encounter for screening mammogram for malignant neoplasm of breast: Secondary | ICD-10-CM

## 2017-12-04 DIAGNOSIS — L570 Actinic keratosis: Secondary | ICD-10-CM | POA: Diagnosis not present

## 2017-12-04 DIAGNOSIS — L821 Other seborrheic keratosis: Secondary | ICD-10-CM | POA: Diagnosis not present

## 2017-12-09 ENCOUNTER — Ambulatory Visit
Admission: RE | Admit: 2017-12-09 | Discharge: 2017-12-09 | Disposition: A | Discharge status: Home / Self Care | Payer: Medicare Other | Source: Ambulatory Visit | Attending: Internal Medicine | Admitting: Internal Medicine

## 2017-12-09 DIAGNOSIS — Z1231 Encounter for screening mammogram for malignant neoplasm of breast: Secondary | ICD-10-CM

## 2017-12-16 DIAGNOSIS — L97911 Non-pressure chronic ulcer of unspecified part of right lower leg limited to breakdown of skin: Secondary | ICD-10-CM | POA: Diagnosis not present

## 2017-12-22 DIAGNOSIS — H26491 Other secondary cataract, right eye: Secondary | ICD-10-CM | POA: Diagnosis not present

## 2017-12-22 DIAGNOSIS — H401232 Low-tension glaucoma, bilateral, moderate stage: Secondary | ICD-10-CM | POA: Diagnosis not present

## 2017-12-22 DIAGNOSIS — Z961 Presence of intraocular lens: Secondary | ICD-10-CM | POA: Diagnosis not present

## 2018-01-20 IMAGING — RF DG SWALLOWING FUNCTION
7 series · 24 of 24 positions shown · non-contrast
Comparison: None.

CLINICAL DATA: Dysphagia, globus sensation

EXAM:
MODIFIED BARIUM SWALLOW
TECHNIQUE: Different consistencies of barium were administered orally to the
patient by the Speech Pathologist. Imaging of the pharynx was
performed in the lateral projection.
FLUOROSCOPY TIME:  Fluoroscopy Time:  0.9 minutes
Radiation Exposure Index (if provided by the fluoroscopic device):
1.3 mGy
Number of Acquired Spot Images: 0

[Series 1: cp_standard · 0.34mm/px · 3 of 52 frames shown (1 of 7)]
[frame 8/52]
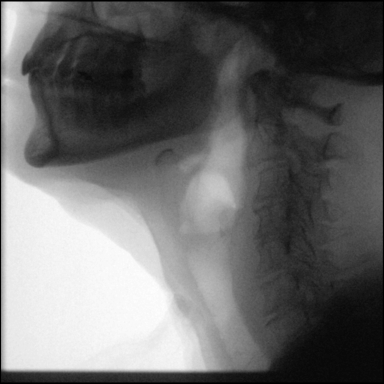
[frame 27/52]
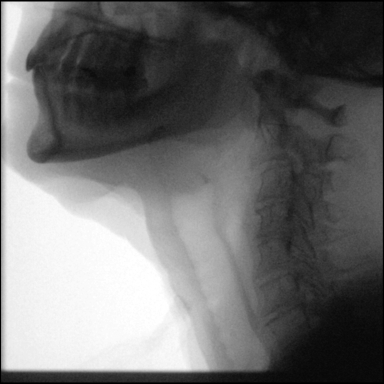
[frame 45/52]
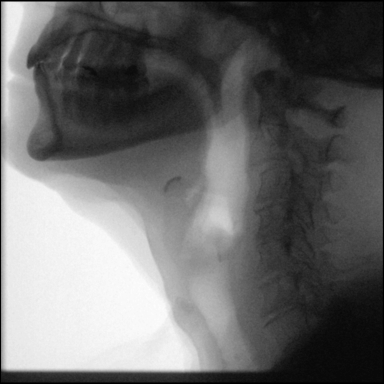

[Series 2: cp_standard · 0.34mm/px · 4 of 90 frames shown (2 of 7)]
[frame 14/90]
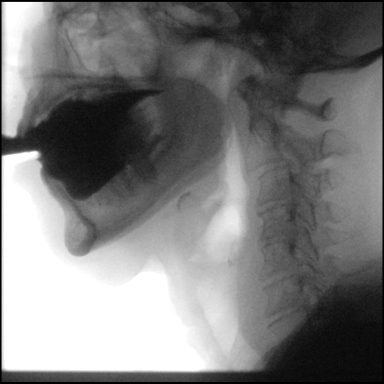
[frame 46/90]
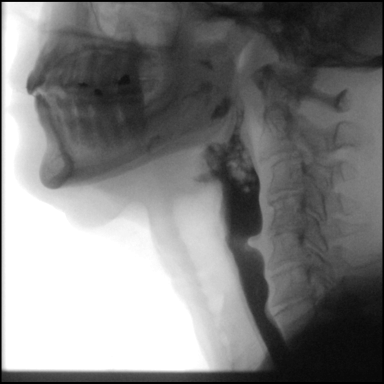
[frame 55/90]
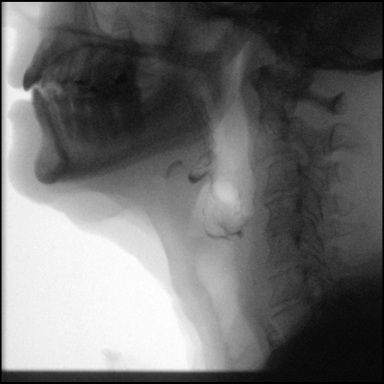
[frame 77/90]
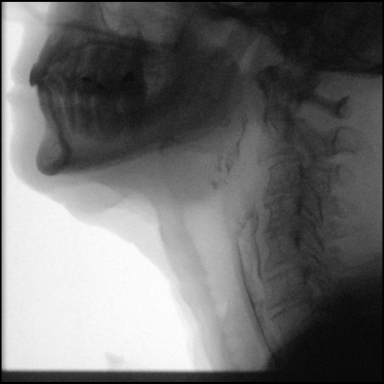

[Series 3: cp_standard · 0.34mm/px · 3 of 80 frames shown (3 of 7)]
[frame 13/80]
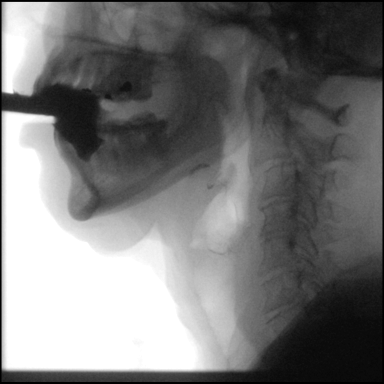
[frame 41/80]
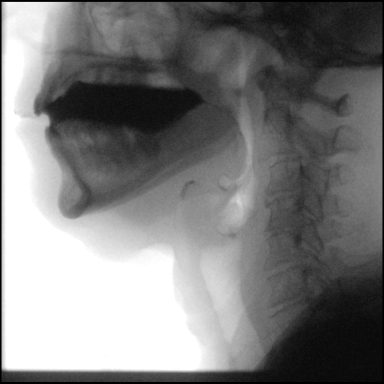
[frame 70/80]
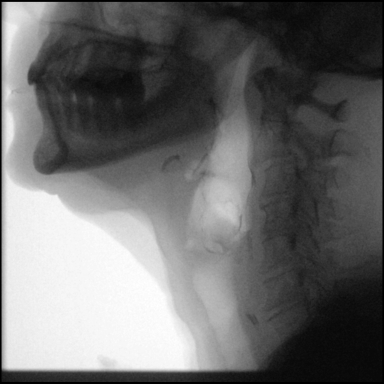

[Series 4: cp_standard · 0.34mm/px · 4 of 43 frames shown (4 of 7)]
[frame 1/43]
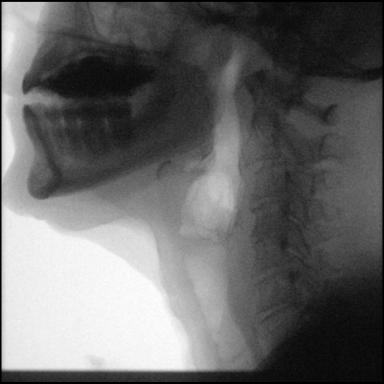
[frame 7/43]
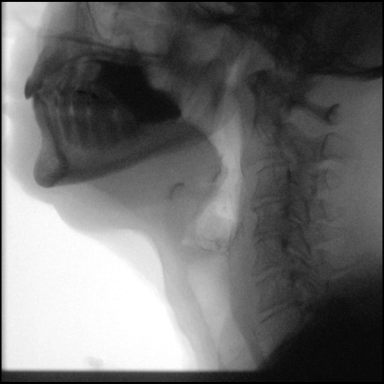
[frame 22/43]
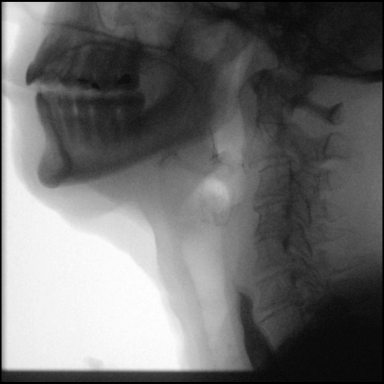
[frame 37/43]
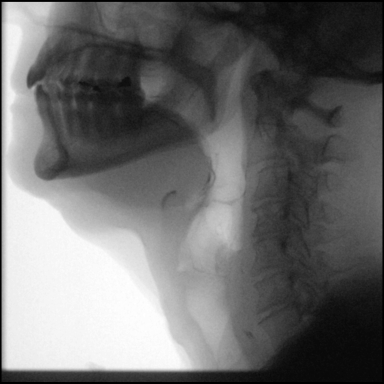

[Series 5: cp_standard · 0.35mm/px · 3 of 155 frames shown (5 of 7)]
[frame 24/155]
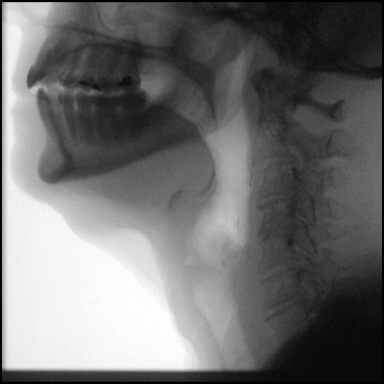
[frame 78/155]
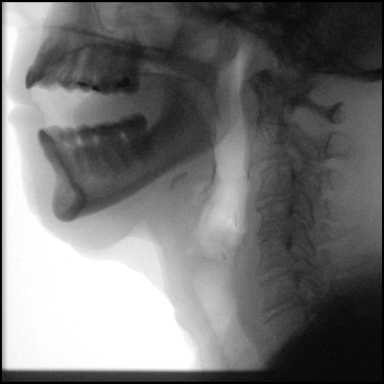
[frame 132/155]
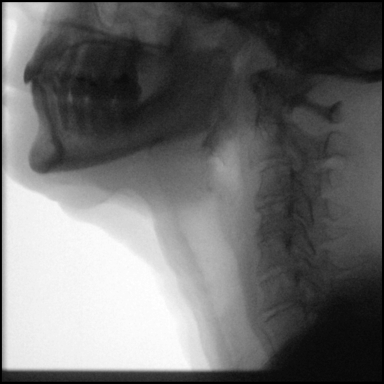

[Series 6: cp_standard · 0.35mm/px · 4 of 189 frames shown (6 of 7)]
[frame 29/189]
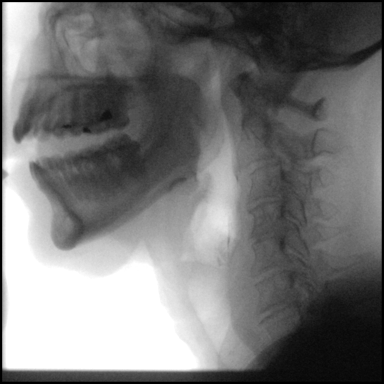
[frame 95/189]
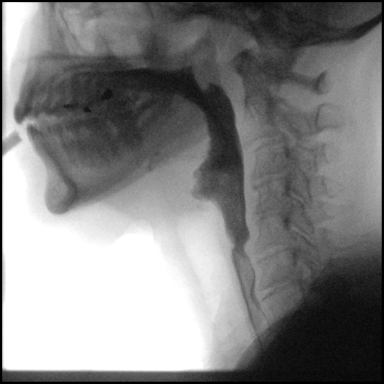
[frame 161/189]
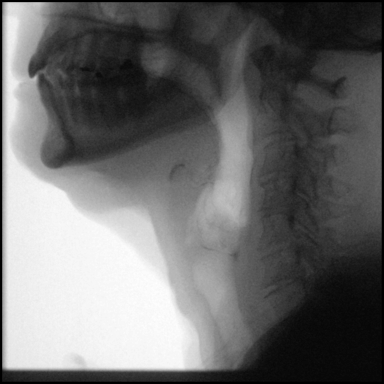
[frame 180/189]
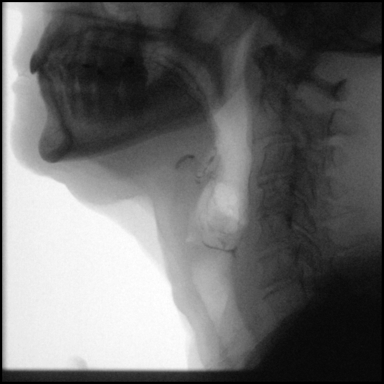

[Series 7: cp_standard · 0.35mm/px · 3 of 78 frames shown (7 of 7)]
[frame 40/78]
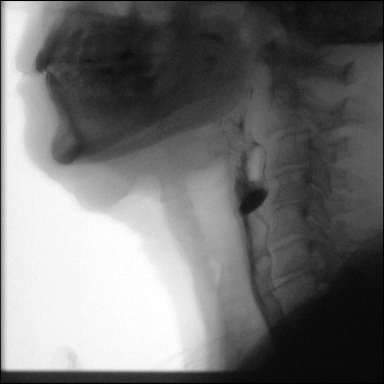
[frame 67/78]
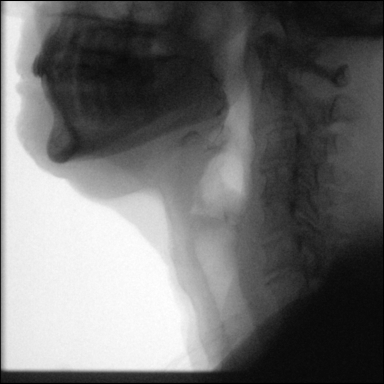
[frame 71/78]
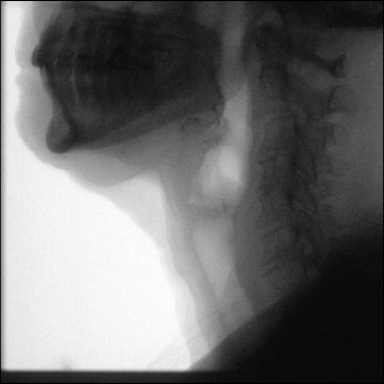

[24 of 24 positions shown; findings below may reference images not displayed]

FINDINGS: Thin liquid- mild cricopharyngeal cysts impression n within normal
limits. Oted posteriorly with all consistencies. Otherwise
swallowing

Nectar thick liquid- within normal limits

Honey- not assessed

Rrago?Jumper within normal limits

Cracker-not assessed

Rrago?Deeale with cracker- within normal limits

Barium tablet -  within normal limits
IMPRESSION: Mildly prominent cricopharyngeus impression. Otherwise unremarkable
study.

Please refer to the Speech Pathologists report for complete details
and recommendations.

## 2018-02-01 ENCOUNTER — Other Ambulatory Visit: Payer: Self-pay | Admitting: Internal Medicine

## 2018-02-01 DIAGNOSIS — K2100 Gastro-esophageal reflux disease with esophagitis, without bleeding: Secondary | ICD-10-CM

## 2018-02-01 DIAGNOSIS — K253 Acute gastric ulcer without hemorrhage or perforation: Secondary | ICD-10-CM

## 2018-02-09 DIAGNOSIS — M859 Disorder of bone density and structure, unspecified: Secondary | ICD-10-CM | POA: Diagnosis not present

## 2018-02-09 DIAGNOSIS — I10 Essential (primary) hypertension: Secondary | ICD-10-CM | POA: Diagnosis not present

## 2018-02-09 DIAGNOSIS — E038 Other specified hypothyroidism: Secondary | ICD-10-CM | POA: Diagnosis not present

## 2018-02-10 DIAGNOSIS — Z01419 Encounter for gynecological examination (general) (routine) without abnormal findings: Secondary | ICD-10-CM | POA: Diagnosis not present

## 2018-02-10 DIAGNOSIS — Z6825 Body mass index (BMI) 25.0-25.9, adult: Secondary | ICD-10-CM | POA: Diagnosis not present

## 2018-02-18 DIAGNOSIS — R011 Cardiac murmur, unspecified: Secondary | ICD-10-CM | POA: Diagnosis not present

## 2018-02-18 DIAGNOSIS — M419 Scoliosis, unspecified: Secondary | ICD-10-CM | POA: Diagnosis not present

## 2018-02-18 DIAGNOSIS — M859 Disorder of bone density and structure, unspecified: Secondary | ICD-10-CM | POA: Diagnosis not present

## 2018-02-18 DIAGNOSIS — I10 Essential (primary) hypertension: Secondary | ICD-10-CM | POA: Diagnosis not present

## 2018-02-18 DIAGNOSIS — Z6824 Body mass index (BMI) 24.0-24.9, adult: Secondary | ICD-10-CM | POA: Diagnosis not present

## 2018-02-18 DIAGNOSIS — Z0000 Encounter for general adult medical examination without abnormal findings: Secondary | ICD-10-CM | POA: Diagnosis not present

## 2018-02-18 DIAGNOSIS — D649 Anemia, unspecified: Secondary | ICD-10-CM | POA: Diagnosis not present

## 2018-02-18 DIAGNOSIS — Z1389 Encounter for screening for other disorder: Secondary | ICD-10-CM | POA: Diagnosis not present

## 2018-02-18 DIAGNOSIS — E038 Other specified hypothyroidism: Secondary | ICD-10-CM | POA: Diagnosis not present

## 2018-02-18 DIAGNOSIS — M722 Plantar fascial fibromatosis: Secondary | ICD-10-CM | POA: Diagnosis not present

## 2018-02-18 DIAGNOSIS — Q265 Anomalous portal venous connection: Secondary | ICD-10-CM | POA: Diagnosis not present

## 2018-02-18 DIAGNOSIS — E7849 Other hyperlipidemia: Secondary | ICD-10-CM | POA: Diagnosis not present

## 2018-02-20 DIAGNOSIS — K8689 Other specified diseases of pancreas: Secondary | ICD-10-CM | POA: Diagnosis not present

## 2018-02-20 DIAGNOSIS — R82998 Other abnormal findings in urine: Secondary | ICD-10-CM | POA: Diagnosis not present

## 2018-02-23 DIAGNOSIS — G8929 Other chronic pain: Secondary | ICD-10-CM | POA: Insufficient documentation

## 2018-02-23 DIAGNOSIS — M51369 Other intervertebral disc degeneration, lumbar region without mention of lumbar back pain or lower extremity pain: Secondary | ICD-10-CM | POA: Insufficient documentation

## 2018-02-23 DIAGNOSIS — M545 Low back pain: Secondary | ICD-10-CM | POA: Diagnosis not present

## 2018-02-23 DIAGNOSIS — M4180 Other forms of scoliosis, site unspecified: Secondary | ICD-10-CM | POA: Diagnosis not present

## 2018-03-18 DIAGNOSIS — M4180 Other forms of scoliosis, site unspecified: Secondary | ICD-10-CM | POA: Diagnosis not present

## 2018-03-18 DIAGNOSIS — M5136 Other intervertebral disc degeneration, lumbar region: Secondary | ICD-10-CM | POA: Diagnosis not present

## 2018-03-18 DIAGNOSIS — M47816 Spondylosis without myelopathy or radiculopathy, lumbar region: Secondary | ICD-10-CM | POA: Diagnosis not present

## 2018-03-25 DIAGNOSIS — L821 Other seborrheic keratosis: Secondary | ICD-10-CM | POA: Diagnosis not present

## 2018-03-25 DIAGNOSIS — L570 Actinic keratosis: Secondary | ICD-10-CM | POA: Diagnosis not present

## 2018-04-02 DIAGNOSIS — M47816 Spondylosis without myelopathy or radiculopathy, lumbar region: Secondary | ICD-10-CM | POA: Diagnosis not present

## 2018-04-24 DIAGNOSIS — M4180 Other forms of scoliosis, site unspecified: Secondary | ICD-10-CM | POA: Diagnosis not present

## 2018-04-24 DIAGNOSIS — M5136 Other intervertebral disc degeneration, lumbar region: Secondary | ICD-10-CM | POA: Diagnosis not present

## 2018-04-24 DIAGNOSIS — M545 Low back pain: Secondary | ICD-10-CM | POA: Diagnosis not present

## 2018-05-04 DIAGNOSIS — M545 Low back pain: Secondary | ICD-10-CM | POA: Diagnosis not present

## 2018-05-21 DIAGNOSIS — M545 Low back pain: Secondary | ICD-10-CM | POA: Diagnosis not present

## 2018-05-22 DIAGNOSIS — H26491 Other secondary cataract, right eye: Secondary | ICD-10-CM | POA: Diagnosis not present

## 2018-05-22 DIAGNOSIS — H04123 Dry eye syndrome of bilateral lacrimal glands: Secondary | ICD-10-CM | POA: Diagnosis not present

## 2018-05-22 DIAGNOSIS — H524 Presbyopia: Secondary | ICD-10-CM | POA: Diagnosis not present

## 2018-05-22 DIAGNOSIS — H401232 Low-tension glaucoma, bilateral, moderate stage: Secondary | ICD-10-CM | POA: Diagnosis not present

## 2018-06-11 DIAGNOSIS — H26492 Other secondary cataract, left eye: Secondary | ICD-10-CM | POA: Diagnosis not present

## 2018-06-11 DIAGNOSIS — H26491 Other secondary cataract, right eye: Secondary | ICD-10-CM | POA: Diagnosis not present

## 2018-06-17 DIAGNOSIS — M545 Low back pain: Secondary | ICD-10-CM | POA: Diagnosis not present

## 2018-06-25 DIAGNOSIS — L570 Actinic keratosis: Secondary | ICD-10-CM | POA: Diagnosis not present

## 2018-06-25 DIAGNOSIS — L281 Prurigo nodularis: Secondary | ICD-10-CM | POA: Diagnosis not present

## 2018-06-25 DIAGNOSIS — D485 Neoplasm of uncertain behavior of skin: Secondary | ICD-10-CM | POA: Diagnosis not present

## 2018-06-26 DIAGNOSIS — M5432 Sciatica, left side: Secondary | ICD-10-CM | POA: Diagnosis not present

## 2018-07-17 ENCOUNTER — Encounter: Payer: Self-pay | Admitting: Gastroenterology

## 2018-07-17 ENCOUNTER — Ambulatory Visit (INDEPENDENT_AMBULATORY_CARE_PROVIDER_SITE_OTHER): Payer: Medicare Other | Admitting: Gastroenterology

## 2018-07-17 VITALS — BP 110/78 | HR 70 | Ht 63.0 in | Wt 137.0 lb

## 2018-07-17 DIAGNOSIS — R05 Cough: Secondary | ICD-10-CM

## 2018-07-17 DIAGNOSIS — R053 Chronic cough: Secondary | ICD-10-CM | POA: Insufficient documentation

## 2018-07-17 NOTE — Patient Instructions (Signed)
If you are age 80 or older, your body mass index should be between 23-30. Your Body mass index is 24.27 kg/m. If this is out of the aforementioned range listed, please consider follow up with your Primary Care Provider.  If you are age 30 or younger, your body mass index should be between 19-25. Your Body mass index is 24.27 kg/m. If this is out of the aformentioned range listed, please consider follow up with your Primary Care Provider.   Try over-the-counter Zyrtec in the morning.  Call in 2-3 weeks with an update. Ask for [employer].  Thank you for choosing me and [facility].   Alonza Bogus, PA-C

## 2018-07-17 NOTE — Progress Notes (Signed)
07/17/2018 Uriel Horkey [id number] May 07, 1936   HISTORY OF PRESENT ILLNESS: This is an 82 year old female who is a patient of Dr. Blanch Media.  She was here last year with complaints of a severe cough.  She had an EGD in July 2018 where she was found to have grade A esophagitis.  She saw ENT last year as well and extensive evaluation was fairly unremarkable.  She is on omeprazole 40 mg twice daily and has been on that all along.  She was doing well until just about a month ago when she started with the severe coughing again.  She says that it feels like a tickle or something in the back of her throat.  It wakes her up at night and can be uncontrollable.  She has been spitting up clear mucus.  Denies heartburn, indigestion, chest pain, epigastric pain.  Does notice some increased belching.   Past Medical History:  Diagnosis Date  . Anemia   . Arthritis    spinal arthritis  . Coronary atherosclerosis of native coronary artery    pt denies having any heart problems and never seen the person who entered this in.  . Fatigue   . Hypertension   . Hypothyroidism   . Left ventricular systolic dysfunction   . Plantar fasciitis   . Scoliosis   . Shortness of breath    on exertion   Past Surgical History:  Procedure Laterality Date  . ABDOMINAL HYSTERECTOMY     tah and bso  . APPENDECTOMY    . ENTEROSCOPY N/A 12/21/2013   Procedure: ENTEROSCOPY;  Surgeon: Irene Shipper, MD;  Location: [facility];  Service: Endoscopy;  Laterality: N/A;  . TONSILECTOMY, ADENOIDECTOMY, BILATERAL MYRINGOTOMY AND TUBES    . torn cartlidge repair Right yrs ago    reports that she has never smoked. She has never used smokeless tobacco. She reports that she drinks alcohol. She reports that she does not use drugs. family history includes Heart attack in her mother; Lung cancer in her father. Allergies  Allergen Reactions  . Penicillins Hives  . Pneumococcal Vaccines Swelling      Outpatient Encounter  Medications as of 07/17/2018  Medication Sig  . ALPRAZolam (XANAX) 0.5 MG tablet Take 0.5 tablets by mouth 3 (three) times daily as needed for anxiety or sleep.   Marland Kitchen latanoprost (XALATAN) 0.005 % ophthalmic solution Place 1 drop into both eyes at bedtime.   Marland Kitchen levothyroxine (SYNTHROID, LEVOTHROID) 50 MCG tablet Take 1 tablet by mouth daily before breakfast.   . naproxen sodium (ALEVE) 220 MG tablet Take 220 mg by mouth 2 (two) times daily as needed.  Marland Kitchen omeprazole (PRILOSEC) 40 MG capsule TAKE 1 CAPSULE BY MOUTH TWICE A DAY  . psyllium (METAMUCIL) 58.6 % powder Take 1 packet by mouth daily.  . traZODone (DESYREL) 100 MG tablet Take 100 mg by mouth at bedtime.   . valsartan-hydrochlorothiazide (DIOVAN-HCT) 320-25 MG per tablet Take 1 tablet by mouth every morning.   . [DISCONTINUED] celecoxib (CELEBREX) 200 MG capsule Take 1 capsule (200 mg total) by mouth 2 (two) times daily as needed.  . [DISCONTINUED] Cholecalciferol (VITAMIN D) 2000 units CAPS Take by mouth.  . [DISCONTINUED] 0.9 %  sodium chloride infusion    No facility-administered encounter medications on file as of 07/17/2018.      REVIEW OF SYSTEMS  : All other systems reviewed and negative except where noted in the History of Present Illness.   PHYSICAL EXAM: BP 110/78   Pulse  70   Ht 5\' 3"  (1.6 m)   Wt 137 lb (62.1 kg)   BMI 24.27 kg/m  General: Well developed white female in no acute distress Head: Normocephalic and atraumatic Eyes:  Sclerae anicteric, conjunctiva pink. Ears: Normal auditory acuity Lungs: Clear throughout to auscultation; no increased WOB. Heart: Regular rate and rhythm; no M/R/G. Abdomen: Soft, non-distended.  BS present.  Non-tender. Musculoskeletal: Symmetrical with no gross deformities  Skin: No lesions on visible extremities Extremities: No edema  Neurological: Alert oriented x 4, grossly non-focal Psychological:  Alert and cooperative. Normal mood and affect  ASSESSMENT AND PLAN: *Chronic cough:   She does have reflux as evidenced by esophagitis on EGD last year, but she is on omeprazole 40 mg BID.  Coughing just became an issue again over the past month.  Had seen ENT with extensive evaluation last year as well.  ? If this is allergy related.  She is going to start by taking Zyrtec daily.  She will call back in 2-3 weeks with an update on her symptoms.  Could consider adding pepcid 20 mg at bedtime.   CC:  Burnard Bunting, MD

## 2018-07-18 NOTE — Progress Notes (Signed)
Noted  

## 2018-07-27 DIAGNOSIS — Z23 Encounter for immunization: Secondary | ICD-10-CM | POA: Diagnosis not present

## 2018-08-31 DIAGNOSIS — S81831A Puncture wound without foreign body, right lower leg, initial encounter: Secondary | ICD-10-CM | POA: Diagnosis not present

## 2018-09-03 DIAGNOSIS — I10 Essential (primary) hypertension: Secondary | ICD-10-CM | POA: Diagnosis not present

## 2018-09-03 DIAGNOSIS — K219 Gastro-esophageal reflux disease without esophagitis: Secondary | ICD-10-CM | POA: Diagnosis not present

## 2018-09-03 DIAGNOSIS — E7849 Other hyperlipidemia: Secondary | ICD-10-CM | POA: Diagnosis not present

## 2018-09-03 DIAGNOSIS — Q265 Anomalous portal venous connection: Secondary | ICD-10-CM | POA: Diagnosis not present

## 2018-09-03 DIAGNOSIS — R011 Cardiac murmur, unspecified: Secondary | ICD-10-CM | POA: Diagnosis not present

## 2018-09-03 DIAGNOSIS — I350 Nonrheumatic aortic (valve) stenosis: Secondary | ICD-10-CM | POA: Diagnosis not present

## 2018-09-03 DIAGNOSIS — M722 Plantar fascial fibromatosis: Secondary | ICD-10-CM | POA: Diagnosis not present

## 2018-09-03 DIAGNOSIS — E038 Other specified hypothyroidism: Secondary | ICD-10-CM | POA: Diagnosis not present

## 2018-09-03 DIAGNOSIS — E663 Overweight: Secondary | ICD-10-CM | POA: Diagnosis not present

## 2018-09-03 DIAGNOSIS — M859 Disorder of bone density and structure, unspecified: Secondary | ICD-10-CM | POA: Diagnosis not present

## 2018-09-03 DIAGNOSIS — M419 Scoliosis, unspecified: Secondary | ICD-10-CM | POA: Diagnosis not present

## 2018-09-03 DIAGNOSIS — S81809A Unspecified open wound, unspecified lower leg, initial encounter: Secondary | ICD-10-CM | POA: Diagnosis not present

## 2018-09-09 DIAGNOSIS — Z5189 Encounter for other specified aftercare: Secondary | ICD-10-CM | POA: Diagnosis not present

## 2018-09-09 DIAGNOSIS — Z6825 Body mass index (BMI) 25.0-25.9, adult: Secondary | ICD-10-CM | POA: Diagnosis not present

## 2018-09-22 DIAGNOSIS — G8929 Other chronic pain: Secondary | ICD-10-CM | POA: Diagnosis not present

## 2018-09-22 DIAGNOSIS — M545 Low back pain: Secondary | ICD-10-CM | POA: Diagnosis not present

## 2018-09-22 DIAGNOSIS — M47896 Other spondylosis, lumbar region: Secondary | ICD-10-CM | POA: Diagnosis not present

## 2018-09-22 DIAGNOSIS — M4180 Other forms of scoliosis, site unspecified: Secondary | ICD-10-CM | POA: Diagnosis not present

## 2018-09-22 DIAGNOSIS — M47817 Spondylosis without myelopathy or radiculopathy, lumbosacral region: Secondary | ICD-10-CM | POA: Diagnosis not present

## 2018-09-22 DIAGNOSIS — R52 Pain, unspecified: Secondary | ICD-10-CM | POA: Diagnosis not present

## 2018-09-22 DIAGNOSIS — M5136 Other intervertebral disc degeneration, lumbar region: Secondary | ICD-10-CM | POA: Diagnosis not present

## 2018-09-25 DIAGNOSIS — Z4800 Encounter for change or removal of nonsurgical wound dressing: Secondary | ICD-10-CM | POA: Diagnosis not present

## 2018-09-25 DIAGNOSIS — Z5189 Encounter for other specified aftercare: Secondary | ICD-10-CM | POA: Diagnosis not present

## 2018-09-25 DIAGNOSIS — M79604 Pain in right leg: Secondary | ICD-10-CM | POA: Diagnosis not present

## 2018-09-25 DIAGNOSIS — Z6825 Body mass index (BMI) 25.0-25.9, adult: Secondary | ICD-10-CM | POA: Diagnosis not present

## 2018-10-13 DIAGNOSIS — M47816 Spondylosis without myelopathy or radiculopathy, lumbar region: Secondary | ICD-10-CM | POA: Diagnosis not present

## 2018-10-27 DIAGNOSIS — M4180 Other forms of scoliosis, site unspecified: Secondary | ICD-10-CM | POA: Diagnosis not present

## 2018-10-27 DIAGNOSIS — G8929 Other chronic pain: Secondary | ICD-10-CM | POA: Diagnosis not present

## 2018-10-27 DIAGNOSIS — M5136 Other intervertebral disc degeneration, lumbar region: Secondary | ICD-10-CM | POA: Diagnosis not present

## 2018-10-27 DIAGNOSIS — M545 Low back pain: Secondary | ICD-10-CM | POA: Diagnosis not present

## 2018-11-02 ENCOUNTER — Other Ambulatory Visit: Payer: Self-pay | Admitting: Internal Medicine

## 2018-11-02 DIAGNOSIS — Z1231 Encounter for screening mammogram for malignant neoplasm of breast: Secondary | ICD-10-CM

## 2018-11-09 DIAGNOSIS — H401232 Low-tension glaucoma, bilateral, moderate stage: Secondary | ICD-10-CM | POA: Diagnosis not present

## 2018-11-09 DIAGNOSIS — H16103 Unspecified superficial keratitis, bilateral: Secondary | ICD-10-CM | POA: Diagnosis not present

## 2018-11-09 DIAGNOSIS — H04123 Dry eye syndrome of bilateral lacrimal glands: Secondary | ICD-10-CM | POA: Diagnosis not present

## 2018-11-09 DIAGNOSIS — Z961 Presence of intraocular lens: Secondary | ICD-10-CM | POA: Diagnosis not present

## 2018-12-10 ENCOUNTER — Ambulatory Visit
Admission: RE | Admit: 2018-12-10 | Discharge: 2018-12-10 | Disposition: A | Discharge status: Home / Self Care | Payer: Medicare Other | Source: Ambulatory Visit | Attending: Internal Medicine | Admitting: Internal Medicine

## 2018-12-10 DIAGNOSIS — Z1231 Encounter for screening mammogram for malignant neoplasm of breast: Secondary | ICD-10-CM

## 2018-12-28 DIAGNOSIS — M4180 Other forms of scoliosis, site unspecified: Secondary | ICD-10-CM | POA: Diagnosis not present

## 2019-02-24 DIAGNOSIS — E038 Other specified hypothyroidism: Secondary | ICD-10-CM | POA: Diagnosis not present

## 2019-02-24 DIAGNOSIS — I10 Essential (primary) hypertension: Secondary | ICD-10-CM | POA: Diagnosis not present

## 2019-02-24 DIAGNOSIS — M859 Disorder of bone density and structure, unspecified: Secondary | ICD-10-CM | POA: Diagnosis not present

## 2019-02-26 DIAGNOSIS — R82998 Other abnormal findings in urine: Secondary | ICD-10-CM | POA: Diagnosis not present

## 2019-02-26 DIAGNOSIS — I10 Essential (primary) hypertension: Secondary | ICD-10-CM | POA: Diagnosis not present

## 2019-03-03 DIAGNOSIS — M8580 Other specified disorders of bone density and structure, unspecified site: Secondary | ICD-10-CM | POA: Diagnosis not present

## 2019-03-03 DIAGNOSIS — I10 Essential (primary) hypertension: Secondary | ICD-10-CM | POA: Diagnosis not present

## 2019-03-03 DIAGNOSIS — I350 Nonrheumatic aortic (valve) stenosis: Secondary | ICD-10-CM | POA: Diagnosis not present

## 2019-03-03 DIAGNOSIS — K219 Gastro-esophageal reflux disease without esophagitis: Secondary | ICD-10-CM | POA: Diagnosis not present

## 2019-03-03 DIAGNOSIS — Q265 Anomalous portal venous connection: Secondary | ICD-10-CM | POA: Diagnosis not present

## 2019-03-03 DIAGNOSIS — R011 Cardiac murmur, unspecified: Secondary | ICD-10-CM | POA: Diagnosis not present

## 2019-03-03 DIAGNOSIS — E785 Hyperlipidemia, unspecified: Secondary | ICD-10-CM | POA: Diagnosis not present

## 2019-03-03 DIAGNOSIS — M722 Plantar fascial fibromatosis: Secondary | ICD-10-CM | POA: Diagnosis not present

## 2019-03-03 DIAGNOSIS — M419 Scoliosis, unspecified: Secondary | ICD-10-CM | POA: Diagnosis not present

## 2019-03-03 DIAGNOSIS — D649 Anemia, unspecified: Secondary | ICD-10-CM | POA: Diagnosis not present

## 2019-03-03 DIAGNOSIS — Z0000 Encounter for general adult medical examination without abnormal findings: Secondary | ICD-10-CM | POA: Diagnosis not present

## 2019-03-03 DIAGNOSIS — E039 Hypothyroidism, unspecified: Secondary | ICD-10-CM | POA: Diagnosis not present

## 2019-03-17 DIAGNOSIS — H5340 Unspecified visual field defects: Secondary | ICD-10-CM | POA: Diagnosis not present

## 2019-03-17 DIAGNOSIS — H26492 Other secondary cataract, left eye: Secondary | ICD-10-CM | POA: Diagnosis not present

## 2019-03-17 DIAGNOSIS — Z961 Presence of intraocular lens: Secondary | ICD-10-CM | POA: Diagnosis not present

## 2019-03-17 DIAGNOSIS — H401232 Low-tension glaucoma, bilateral, moderate stage: Secondary | ICD-10-CM | POA: Diagnosis not present

## 2019-05-25 DIAGNOSIS — M5416 Radiculopathy, lumbar region: Secondary | ICD-10-CM | POA: Diagnosis not present

## 2019-06-01 DIAGNOSIS — M47816 Spondylosis without myelopathy or radiculopathy, lumbar region: Secondary | ICD-10-CM | POA: Diagnosis not present

## 2019-06-15 DIAGNOSIS — M4180 Other forms of scoliosis, site unspecified: Secondary | ICD-10-CM | POA: Diagnosis not present

## 2019-06-15 DIAGNOSIS — G894 Chronic pain syndrome: Secondary | ICD-10-CM | POA: Diagnosis not present

## 2019-06-15 DIAGNOSIS — M545 Low back pain: Secondary | ICD-10-CM | POA: Diagnosis not present

## 2019-06-22 DIAGNOSIS — M545 Low back pain: Secondary | ICD-10-CM | POA: Diagnosis not present

## 2019-06-24 DIAGNOSIS — M545 Low back pain: Secondary | ICD-10-CM | POA: Diagnosis not present

## 2019-07-01 DIAGNOSIS — M545 Low back pain: Secondary | ICD-10-CM | POA: Diagnosis not present

## 2019-07-03 DIAGNOSIS — M545 Low back pain: Secondary | ICD-10-CM | POA: Diagnosis not present

## 2019-07-06 DIAGNOSIS — M545 Low back pain: Secondary | ICD-10-CM | POA: Diagnosis not present

## 2019-07-12 DIAGNOSIS — M545 Low back pain: Secondary | ICD-10-CM | POA: Diagnosis not present

## 2019-07-14 DIAGNOSIS — M545 Low back pain: Secondary | ICD-10-CM | POA: Diagnosis not present

## 2019-07-19 DIAGNOSIS — M545 Low back pain: Secondary | ICD-10-CM | POA: Diagnosis not present

## 2019-07-22 DIAGNOSIS — M545 Low back pain: Secondary | ICD-10-CM | POA: Diagnosis not present

## 2019-07-24 DIAGNOSIS — Z23 Encounter for immunization: Secondary | ICD-10-CM | POA: Diagnosis not present

## 2019-07-26 DIAGNOSIS — M545 Low back pain: Secondary | ICD-10-CM | POA: Diagnosis not present

## 2019-07-29 DIAGNOSIS — M545 Low back pain: Secondary | ICD-10-CM | POA: Diagnosis not present

## 2019-08-02 DIAGNOSIS — M545 Low back pain: Secondary | ICD-10-CM | POA: Diagnosis not present

## 2019-08-05 DIAGNOSIS — M545 Low back pain: Secondary | ICD-10-CM | POA: Diagnosis not present

## 2019-08-10 DIAGNOSIS — M545 Low back pain: Secondary | ICD-10-CM | POA: Diagnosis not present

## 2019-08-12 DIAGNOSIS — M545 Low back pain: Secondary | ICD-10-CM | POA: Diagnosis not present

## 2019-08-16 DIAGNOSIS — M545 Low back pain: Secondary | ICD-10-CM | POA: Diagnosis not present

## 2019-08-18 DIAGNOSIS — H401232 Low-tension glaucoma, bilateral, moderate stage: Secondary | ICD-10-CM | POA: Diagnosis not present

## 2019-08-18 DIAGNOSIS — Z961 Presence of intraocular lens: Secondary | ICD-10-CM | POA: Diagnosis not present

## 2019-08-18 DIAGNOSIS — H04123 Dry eye syndrome of bilateral lacrimal glands: Secondary | ICD-10-CM | POA: Diagnosis not present

## 2019-08-19 DIAGNOSIS — M545 Low back pain: Secondary | ICD-10-CM | POA: Diagnosis not present

## 2019-08-19 DIAGNOSIS — Z01419 Encounter for gynecological examination (general) (routine) without abnormal findings: Secondary | ICD-10-CM | POA: Diagnosis not present

## 2019-08-19 DIAGNOSIS — Z6826 Body mass index (BMI) 26.0-26.9, adult: Secondary | ICD-10-CM | POA: Diagnosis not present

## 2019-08-24 DIAGNOSIS — M79674 Pain in right toe(s): Secondary | ICD-10-CM | POA: Insufficient documentation

## 2019-08-26 DIAGNOSIS — M545 Low back pain: Secondary | ICD-10-CM | POA: Diagnosis not present

## 2019-08-27 DIAGNOSIS — L600 Ingrowing nail: Secondary | ICD-10-CM | POA: Diagnosis not present

## 2019-08-30 DIAGNOSIS — M545 Low back pain: Secondary | ICD-10-CM | POA: Diagnosis not present

## 2019-09-06 DIAGNOSIS — M722 Plantar fascial fibromatosis: Secondary | ICD-10-CM | POA: Diagnosis not present

## 2019-09-06 DIAGNOSIS — K219 Gastro-esophageal reflux disease without esophagitis: Secondary | ICD-10-CM | POA: Diagnosis not present

## 2019-09-06 DIAGNOSIS — E663 Overweight: Secondary | ICD-10-CM | POA: Diagnosis not present

## 2019-09-06 DIAGNOSIS — M8580 Other specified disorders of bone density and structure, unspecified site: Secondary | ICD-10-CM | POA: Diagnosis not present

## 2019-09-06 DIAGNOSIS — Q265 Anomalous portal venous connection: Secondary | ICD-10-CM | POA: Diagnosis not present

## 2019-09-06 DIAGNOSIS — R011 Cardiac murmur, unspecified: Secondary | ICD-10-CM | POA: Diagnosis not present

## 2019-09-06 DIAGNOSIS — I10 Essential (primary) hypertension: Secondary | ICD-10-CM | POA: Diagnosis not present

## 2019-09-06 DIAGNOSIS — E039 Hypothyroidism, unspecified: Secondary | ICD-10-CM | POA: Diagnosis not present

## 2019-09-06 DIAGNOSIS — M419 Scoliosis, unspecified: Secondary | ICD-10-CM | POA: Diagnosis not present

## 2019-09-06 DIAGNOSIS — I350 Nonrheumatic aortic (valve) stenosis: Secondary | ICD-10-CM | POA: Diagnosis not present

## 2019-09-06 DIAGNOSIS — E785 Hyperlipidemia, unspecified: Secondary | ICD-10-CM | POA: Diagnosis not present

## 2019-09-06 DIAGNOSIS — D649 Anemia, unspecified: Secondary | ICD-10-CM | POA: Diagnosis not present

## 2019-09-15 DIAGNOSIS — H401232 Low-tension glaucoma, bilateral, moderate stage: Secondary | ICD-10-CM | POA: Diagnosis not present

## 2019-09-15 DIAGNOSIS — H04123 Dry eye syndrome of bilateral lacrimal glands: Secondary | ICD-10-CM | POA: Diagnosis not present

## 2019-09-15 DIAGNOSIS — H16103 Unspecified superficial keratitis, bilateral: Secondary | ICD-10-CM | POA: Diagnosis not present

## 2019-10-28 DIAGNOSIS — G894 Chronic pain syndrome: Secondary | ICD-10-CM | POA: Diagnosis not present

## 2019-10-28 DIAGNOSIS — M545 Low back pain: Secondary | ICD-10-CM | POA: Diagnosis not present

## 2019-10-28 DIAGNOSIS — M5136 Other intervertebral disc degeneration, lumbar region: Secondary | ICD-10-CM | POA: Diagnosis not present

## 2019-10-31 ENCOUNTER — Other Ambulatory Visit: Payer: Self-pay

## 2019-10-31 ENCOUNTER — Ambulatory Visit: Payer: Medicare Other | Attending: Internal Medicine

## 2019-10-31 DIAGNOSIS — Z23 Encounter for immunization: Secondary | ICD-10-CM | POA: Diagnosis not present

## 2019-10-31 NOTE — Progress Notes (Signed)
   Covid-19 Vaccination Clinic  Name:  Monique Wright    MRN: [id number] DOB: [birth year]  10/31/2019  [name] was observed post Covid-19 immunization for 15 minutes without incidence. She was provided with Vaccine Information Sheet and instruction to access the V-Safe system.   [name] was instructed to call 911 with any severe reactions post vaccine: Marland Kitchen Difficulty breathing  . Swelling of your face and throat  . A fast heartbeat  . A bad rash all over your body  . Dizziness and weakness    Immunizations Administered    Name Date Dose VIS Date Route   Pfizer COVID-19 Vaccine 10/31/2019  1:48 PM 0.3 mL 10/01/2019 Intramuscular   Manufacturer: [employer]   Lot: [id number]   [place]: [id number]

## 2019-11-02 ENCOUNTER — Other Ambulatory Visit: Payer: Self-pay | Admitting: Internal Medicine

## 2019-11-02 DIAGNOSIS — Z1231 Encounter for screening mammogram for malignant neoplasm of breast: Secondary | ICD-10-CM

## 2019-11-21 ENCOUNTER — Ambulatory Visit: Payer: Medicare Other | Attending: Internal Medicine

## 2019-11-21 DIAGNOSIS — Z23 Encounter for immunization: Secondary | ICD-10-CM

## 2019-11-21 NOTE — Progress Notes (Signed)
   Covid-19 Vaccination Clinic  Name:  Zelna Poarch    MRN: [id number] DOB: November 23, 1935  11/21/2019  Ms. Spaar was observed post Covid-19 immunization for 15 minutes without incidence. She was provided with Vaccine Information Sheet and instruction to access the V-Safe system.   Ms. Anthis was instructed to call 911 with any severe reactions post vaccine: Marland Kitchen Difficulty breathing  . Swelling of your face and throat  . A fast heartbeat  . A bad rash all over your body  . Dizziness and weakness    Immunizations Administered    Name Date Dose VIS Date Route   Pfizer COVID-19 Vaccine 11/21/2019  1:02 PM 0.3 mL 10/01/2019 Intramuscular   Manufacturer: [place]   Lot: [id number]   [place]: [id number]

## 2019-12-13 ENCOUNTER — Other Ambulatory Visit: Payer: Self-pay

## 2019-12-13 ENCOUNTER — Ambulatory Visit
Admission: RE | Admit: 2019-12-13 | Discharge: 2019-12-13 | Disposition: A | Discharge status: Home / Self Care | Payer: Medicare Other | Source: Ambulatory Visit | Attending: Internal Medicine | Admitting: Internal Medicine

## 2019-12-13 DIAGNOSIS — Z1231 Encounter for screening mammogram for malignant neoplasm of breast: Secondary | ICD-10-CM

## 2019-12-21 DIAGNOSIS — M21611 Bunion of right foot: Secondary | ICD-10-CM | POA: Diagnosis not present

## 2019-12-21 DIAGNOSIS — L602 Onychogryphosis: Secondary | ICD-10-CM | POA: Diagnosis not present

## 2020-01-19 DIAGNOSIS — Z961 Presence of intraocular lens: Secondary | ICD-10-CM | POA: Diagnosis not present

## 2020-01-19 DIAGNOSIS — H04123 Dry eye syndrome of bilateral lacrimal glands: Secondary | ICD-10-CM | POA: Diagnosis not present

## 2020-01-19 DIAGNOSIS — H401132 Primary open-angle glaucoma, bilateral, moderate stage: Secondary | ICD-10-CM | POA: Diagnosis not present

## 2020-01-19 DIAGNOSIS — G43909 Migraine, unspecified, not intractable, without status migrainosus: Secondary | ICD-10-CM | POA: Diagnosis not present

## 2020-01-26 DIAGNOSIS — M47896 Other spondylosis, lumbar region: Secondary | ICD-10-CM | POA: Diagnosis not present

## 2020-01-26 DIAGNOSIS — M533 Sacrococcygeal disorders, not elsewhere classified: Secondary | ICD-10-CM | POA: Diagnosis not present

## 2020-01-26 DIAGNOSIS — G894 Chronic pain syndrome: Secondary | ICD-10-CM | POA: Diagnosis not present

## 2020-02-10 DIAGNOSIS — M533 Sacrococcygeal disorders, not elsewhere classified: Secondary | ICD-10-CM | POA: Insufficient documentation

## 2020-02-15 DIAGNOSIS — M47816 Spondylosis without myelopathy or radiculopathy, lumbar region: Secondary | ICD-10-CM | POA: Diagnosis not present

## 2020-02-28 DIAGNOSIS — M859 Disorder of bone density and structure, unspecified: Secondary | ICD-10-CM | POA: Diagnosis not present

## 2020-02-28 DIAGNOSIS — E038 Other specified hypothyroidism: Secondary | ICD-10-CM | POA: Diagnosis not present

## 2020-02-28 DIAGNOSIS — E7849 Other hyperlipidemia: Secondary | ICD-10-CM | POA: Diagnosis not present

## 2020-02-29 DIAGNOSIS — M533 Sacrococcygeal disorders, not elsewhere classified: Secondary | ICD-10-CM | POA: Diagnosis not present

## 2020-03-13 DIAGNOSIS — M419 Scoliosis, unspecified: Secondary | ICD-10-CM | POA: Diagnosis not present

## 2020-03-13 DIAGNOSIS — I10 Essential (primary) hypertension: Secondary | ICD-10-CM | POA: Diagnosis not present

## 2020-03-13 DIAGNOSIS — R011 Cardiac murmur, unspecified: Secondary | ICD-10-CM | POA: Diagnosis not present

## 2020-03-13 DIAGNOSIS — I350 Nonrheumatic aortic (valve) stenosis: Secondary | ICD-10-CM | POA: Diagnosis not present

## 2020-03-13 DIAGNOSIS — R82998 Other abnormal findings in urine: Secondary | ICD-10-CM | POA: Diagnosis not present

## 2020-03-13 DIAGNOSIS — E663 Overweight: Secondary | ICD-10-CM | POA: Diagnosis not present

## 2020-03-13 DIAGNOSIS — M859 Disorder of bone density and structure, unspecified: Secondary | ICD-10-CM | POA: Diagnosis not present

## 2020-03-13 DIAGNOSIS — M722 Plantar fascial fibromatosis: Secondary | ICD-10-CM | POA: Diagnosis not present

## 2020-03-13 DIAGNOSIS — D649 Anemia, unspecified: Secondary | ICD-10-CM | POA: Diagnosis not present

## 2020-03-13 DIAGNOSIS — E7849 Other hyperlipidemia: Secondary | ICD-10-CM | POA: Diagnosis not present

## 2020-03-13 DIAGNOSIS — E038 Other specified hypothyroidism: Secondary | ICD-10-CM | POA: Diagnosis not present

## 2020-03-13 DIAGNOSIS — Z1331 Encounter for screening for depression: Secondary | ICD-10-CM | POA: Diagnosis not present

## 2020-03-13 DIAGNOSIS — Z0000 Encounter for general adult medical examination without abnormal findings: Secondary | ICD-10-CM | POA: Diagnosis not present

## 2020-03-21 DIAGNOSIS — M545 Low back pain: Secondary | ICD-10-CM | POA: Diagnosis not present

## 2020-03-21 DIAGNOSIS — Z79891 Long term (current) use of opiate analgesic: Secondary | ICD-10-CM | POA: Diagnosis not present

## 2020-03-21 DIAGNOSIS — M5136 Other intervertebral disc degeneration, lumbar region: Secondary | ICD-10-CM | POA: Diagnosis not present

## 2020-03-22 DIAGNOSIS — L570 Actinic keratosis: Secondary | ICD-10-CM | POA: Diagnosis not present

## 2020-03-22 DIAGNOSIS — L821 Other seborrheic keratosis: Secondary | ICD-10-CM | POA: Diagnosis not present

## 2020-05-22 DIAGNOSIS — M5136 Other intervertebral disc degeneration, lumbar region: Secondary | ICD-10-CM | POA: Diagnosis not present

## 2020-05-22 DIAGNOSIS — Z5181 Encounter for therapeutic drug level monitoring: Secondary | ICD-10-CM | POA: Diagnosis not present

## 2020-05-22 DIAGNOSIS — Z79891 Long term (current) use of opiate analgesic: Secondary | ICD-10-CM | POA: Diagnosis not present

## 2020-05-22 DIAGNOSIS — Z79899 Other long term (current) drug therapy: Secondary | ICD-10-CM | POA: Diagnosis not present

## 2020-05-22 DIAGNOSIS — M4180 Other forms of scoliosis, site unspecified: Secondary | ICD-10-CM | POA: Diagnosis not present

## 2020-05-31 DIAGNOSIS — H26492 Other secondary cataract, left eye: Secondary | ICD-10-CM | POA: Diagnosis not present

## 2020-05-31 DIAGNOSIS — H401232 Low-tension glaucoma, bilateral, moderate stage: Secondary | ICD-10-CM | POA: Diagnosis not present

## 2020-05-31 DIAGNOSIS — H04123 Dry eye syndrome of bilateral lacrimal glands: Secondary | ICD-10-CM | POA: Diagnosis not present

## 2020-05-31 DIAGNOSIS — H43813 Vitreous degeneration, bilateral: Secondary | ICD-10-CM | POA: Diagnosis not present

## 2020-07-14 DIAGNOSIS — H0100B Unspecified blepharitis left eye, upper and lower eyelids: Secondary | ICD-10-CM | POA: Diagnosis not present

## 2020-07-14 DIAGNOSIS — H0100A Unspecified blepharitis right eye, upper and lower eyelids: Secondary | ICD-10-CM | POA: Diagnosis not present

## 2020-07-29 DIAGNOSIS — Z23 Encounter for immunization: Secondary | ICD-10-CM | POA: Diagnosis not present

## 2020-08-31 DIAGNOSIS — G894 Chronic pain syndrome: Secondary | ICD-10-CM | POA: Diagnosis not present

## 2020-08-31 DIAGNOSIS — M791 Myalgia, unspecified site: Secondary | ICD-10-CM | POA: Diagnosis not present

## 2020-09-13 DIAGNOSIS — M5136 Other intervertebral disc degeneration, lumbar region: Secondary | ICD-10-CM | POA: Diagnosis not present

## 2020-09-18 DIAGNOSIS — M419 Scoliosis, unspecified: Secondary | ICD-10-CM | POA: Diagnosis not present

## 2020-09-18 DIAGNOSIS — I10 Essential (primary) hypertension: Secondary | ICD-10-CM | POA: Diagnosis not present

## 2020-09-25 DIAGNOSIS — M5416 Radiculopathy, lumbar region: Secondary | ICD-10-CM | POA: Diagnosis not present

## 2020-10-17 DIAGNOSIS — M5416 Radiculopathy, lumbar region: Secondary | ICD-10-CM | POA: Diagnosis not present

## 2020-10-20 DIAGNOSIS — Z20822 Contact with and (suspected) exposure to covid-19: Secondary | ICD-10-CM | POA: Diagnosis not present

## 2020-11-03 ENCOUNTER — Other Ambulatory Visit: Payer: Self-pay | Admitting: Internal Medicine

## 2020-11-03 DIAGNOSIS — Z1231 Encounter for screening mammogram for malignant neoplasm of breast: Secondary | ICD-10-CM

## 2020-11-22 DIAGNOSIS — M533 Sacrococcygeal disorders, not elsewhere classified: Secondary | ICD-10-CM | POA: Diagnosis not present

## 2020-12-06 DIAGNOSIS — G894 Chronic pain syndrome: Secondary | ICD-10-CM | POA: Diagnosis not present

## 2020-12-18 ENCOUNTER — Inpatient Hospital Stay: Admission: RE | Admit: 2020-12-18 | Payer: Medicare Other | Source: Ambulatory Visit

## 2020-12-21 DIAGNOSIS — H26492 Other secondary cataract, left eye: Secondary | ICD-10-CM | POA: Diagnosis not present

## 2020-12-23 ENCOUNTER — Ambulatory Visit
Admission: RE | Admit: 2020-12-23 | Discharge: 2020-12-23 | Disposition: A | Discharge status: Home / Self Care | Payer: Medicare Other | Source: Ambulatory Visit | Attending: Internal Medicine | Admitting: Internal Medicine

## 2020-12-23 ENCOUNTER — Other Ambulatory Visit: Payer: Self-pay

## 2020-12-23 DIAGNOSIS — Z1231 Encounter for screening mammogram for malignant neoplasm of breast: Secondary | ICD-10-CM

## 2021-01-11 DIAGNOSIS — M545 Low back pain, unspecified: Secondary | ICD-10-CM | POA: Diagnosis not present

## 2021-01-31 DIAGNOSIS — H5213 Myopia, bilateral: Secondary | ICD-10-CM | POA: Diagnosis not present

## 2021-01-31 DIAGNOSIS — H43813 Vitreous degeneration, bilateral: Secondary | ICD-10-CM | POA: Diagnosis not present

## 2021-01-31 DIAGNOSIS — Z961 Presence of intraocular lens: Secondary | ICD-10-CM | POA: Diagnosis not present

## 2021-01-31 DIAGNOSIS — H401232 Low-tension glaucoma, bilateral, moderate stage: Secondary | ICD-10-CM | POA: Diagnosis not present

## 2021-03-08 DIAGNOSIS — M419 Scoliosis, unspecified: Secondary | ICD-10-CM | POA: Diagnosis not present

## 2021-03-08 DIAGNOSIS — M47816 Spondylosis without myelopathy or radiculopathy, lumbar region: Secondary | ICD-10-CM | POA: Diagnosis not present

## 2021-03-21 DIAGNOSIS — M47816 Spondylosis without myelopathy or radiculopathy, lumbar region: Secondary | ICD-10-CM | POA: Diagnosis not present

## 2021-03-22 DIAGNOSIS — E785 Hyperlipidemia, unspecified: Secondary | ICD-10-CM | POA: Diagnosis not present

## 2021-03-22 DIAGNOSIS — Z0000 Encounter for general adult medical examination without abnormal findings: Secondary | ICD-10-CM | POA: Diagnosis not present

## 2021-03-22 DIAGNOSIS — M859 Disorder of bone density and structure, unspecified: Secondary | ICD-10-CM | POA: Diagnosis not present

## 2021-03-22 DIAGNOSIS — E039 Hypothyroidism, unspecified: Secondary | ICD-10-CM | POA: Diagnosis not present

## 2021-03-28 DIAGNOSIS — R82998 Other abnormal findings in urine: Secondary | ICD-10-CM | POA: Diagnosis not present

## 2021-03-28 DIAGNOSIS — E785 Hyperlipidemia, unspecified: Secondary | ICD-10-CM | POA: Diagnosis not present

## 2021-03-28 DIAGNOSIS — B379 Candidiasis, unspecified: Secondary | ICD-10-CM | POA: Diagnosis not present

## 2021-03-28 DIAGNOSIS — E039 Hypothyroidism, unspecified: Secondary | ICD-10-CM | POA: Diagnosis not present

## 2021-03-28 DIAGNOSIS — I350 Nonrheumatic aortic (valve) stenosis: Secondary | ICD-10-CM | POA: Diagnosis not present

## 2021-03-28 DIAGNOSIS — M419 Scoliosis, unspecified: Secondary | ICD-10-CM | POA: Diagnosis not present

## 2021-03-28 DIAGNOSIS — Z0000 Encounter for general adult medical examination without abnormal findings: Secondary | ICD-10-CM | POA: Diagnosis not present

## 2021-03-28 DIAGNOSIS — R011 Cardiac murmur, unspecified: Secondary | ICD-10-CM | POA: Diagnosis not present

## 2021-03-28 DIAGNOSIS — I10 Essential (primary) hypertension: Secondary | ICD-10-CM | POA: Diagnosis not present

## 2021-03-28 DIAGNOSIS — K219 Gastro-esophageal reflux disease without esophagitis: Secondary | ICD-10-CM | POA: Diagnosis not present

## 2021-04-03 DIAGNOSIS — M47816 Spondylosis without myelopathy or radiculopathy, lumbar region: Secondary | ICD-10-CM | POA: Diagnosis not present

## 2021-05-07 DIAGNOSIS — M47816 Spondylosis without myelopathy or radiculopathy, lumbar region: Secondary | ICD-10-CM | POA: Diagnosis not present

## 2021-06-04 DIAGNOSIS — M419 Scoliosis, unspecified: Secondary | ICD-10-CM | POA: Diagnosis not present

## 2021-06-04 DIAGNOSIS — Z79891 Long term (current) use of opiate analgesic: Secondary | ICD-10-CM | POA: Diagnosis not present

## 2021-06-04 DIAGNOSIS — G894 Chronic pain syndrome: Secondary | ICD-10-CM | POA: Diagnosis not present

## 2021-06-04 DIAGNOSIS — M47816 Spondylosis without myelopathy or radiculopathy, lumbar region: Secondary | ICD-10-CM | POA: Diagnosis not present

## 2021-06-04 DIAGNOSIS — Z79899 Other long term (current) drug therapy: Secondary | ICD-10-CM | POA: Diagnosis not present

## 2021-06-29 DIAGNOSIS — H401232 Low-tension glaucoma, bilateral, moderate stage: Secondary | ICD-10-CM | POA: Diagnosis not present

## 2021-08-22 DIAGNOSIS — L281 Prurigo nodularis: Secondary | ICD-10-CM | POA: Diagnosis not present

## 2021-08-27 ENCOUNTER — Other Ambulatory Visit (HOSPITAL_BASED_OUTPATIENT_CLINIC_OR_DEPARTMENT_OTHER): Payer: Self-pay

## 2021-08-27 MED ORDER — FLUAD QUADRIVALENT 0.5 ML IM PRSY
PREFILLED_SYRINGE | INTRAMUSCULAR | 0 refills | Status: DC
  Filled 2021-08-27: qty 0.5, 1d supply, fill #0

## 2021-09-04 ENCOUNTER — Other Ambulatory Visit (HOSPITAL_BASED_OUTPATIENT_CLINIC_OR_DEPARTMENT_OTHER): Payer: Self-pay

## 2021-09-04 DIAGNOSIS — M791 Myalgia, unspecified site: Secondary | ICD-10-CM | POA: Diagnosis not present

## 2021-09-04 DIAGNOSIS — M419 Scoliosis, unspecified: Secondary | ICD-10-CM | POA: Diagnosis not present

## 2021-09-04 DIAGNOSIS — M47816 Spondylosis without myelopathy or radiculopathy, lumbar region: Secondary | ICD-10-CM | POA: Diagnosis not present

## 2021-09-04 MED ORDER — TRAMADOL HCL 50 MG PO TABS
ORAL_TABLET | ORAL | 1 refills | Status: DC
  Filled 2021-09-04: qty 7, 7d supply, fill #0
  Filled 2021-10-31: qty 7, 7d supply, fill #1
  Filled 2021-12-14: qty 30, 30d supply, fill #2
  Filled 2022-02-19: qty 16, 16d supply, fill #3

## 2021-09-10 DIAGNOSIS — J3489 Other specified disorders of nose and nasal sinuses: Secondary | ICD-10-CM | POA: Diagnosis not present

## 2021-09-10 DIAGNOSIS — R053 Chronic cough: Secondary | ICD-10-CM | POA: Diagnosis not present

## 2021-09-25 DIAGNOSIS — H16103 Unspecified superficial keratitis, bilateral: Secondary | ICD-10-CM | POA: Diagnosis not present

## 2021-09-25 DIAGNOSIS — H401232 Low-tension glaucoma, bilateral, moderate stage: Secondary | ICD-10-CM | POA: Diagnosis not present

## 2021-09-25 DIAGNOSIS — H04123 Dry eye syndrome of bilateral lacrimal glands: Secondary | ICD-10-CM | POA: Diagnosis not present

## 2021-09-27 ENCOUNTER — Other Ambulatory Visit (HOSPITAL_BASED_OUTPATIENT_CLINIC_OR_DEPARTMENT_OTHER): Payer: Self-pay

## 2021-09-27 ENCOUNTER — Ambulatory Visit: Payer: Medicare Other | Attending: Internal Medicine

## 2021-09-27 DIAGNOSIS — Z23 Encounter for immunization: Secondary | ICD-10-CM

## 2021-09-27 MED ORDER — PFIZER COVID-19 VAC BIVALENT 30 MCG/0.3ML IM SUSP
INTRAMUSCULAR | 0 refills | Status: DC
  Filled 2021-09-27: qty 0.3, 1d supply, fill #0

## 2021-09-27 NOTE — Progress Notes (Signed)
   Covid-19 Vaccination Clinic  Name:  Rianna Lukes    MRN: [id number] DOB: January 18, 1936  09/27/2021  Ms. Barner was observed post Covid-19 immunization for 15 minutes without incident. She was provided with Vaccine Information Sheet and instruction to access the V-Safe system.   Ms. Vallo was instructed to call 911 with any severe reactions post vaccine: Difficulty breathing  Swelling of face and throat  A fast heartbeat  A bad rash all over body  Dizziness and weakness   Immunizations Administered     Name Date Dose VIS Date Route   Pfizer Covid-19 Vaccine Bivalent Booster 09/27/2021 12:42 PM 0.3 mL 06/20/2021 Intramuscular   Manufacturer: [place]   Lot: [id number]   [place]: [phone]

## 2021-10-01 DIAGNOSIS — I493 Ventricular premature depolarization: Secondary | ICD-10-CM | POA: Diagnosis not present

## 2021-10-12 DIAGNOSIS — Z20822 Contact with and (suspected) exposure to covid-19: Secondary | ICD-10-CM | POA: Diagnosis not present

## 2021-10-18 ENCOUNTER — Other Ambulatory Visit: Payer: Self-pay

## 2021-10-18 ENCOUNTER — Encounter: Payer: Self-pay | Admitting: Cardiology

## 2021-10-18 ENCOUNTER — Ambulatory Visit (INDEPENDENT_AMBULATORY_CARE_PROVIDER_SITE_OTHER): Payer: Medicare Other | Admitting: Cardiology

## 2021-10-18 ENCOUNTER — Ambulatory Visit (INDEPENDENT_AMBULATORY_CARE_PROVIDER_SITE_OTHER): Payer: Medicare PPO

## 2021-10-18 VITALS — BP 150/90 | HR 82 | Ht 63.0 in | Wt 140.4 lb

## 2021-10-18 DIAGNOSIS — I493 Ventricular premature depolarization: Secondary | ICD-10-CM | POA: Diagnosis not present

## 2021-10-18 DIAGNOSIS — E785 Hyperlipidemia, unspecified: Secondary | ICD-10-CM

## 2021-10-18 DIAGNOSIS — I10 Essential (primary) hypertension: Secondary | ICD-10-CM | POA: Diagnosis not present

## 2021-10-18 DIAGNOSIS — I350 Nonrheumatic aortic (valve) stenosis: Secondary | ICD-10-CM | POA: Diagnosis not present

## 2021-10-18 DIAGNOSIS — R0609 Other forms of dyspnea: Secondary | ICD-10-CM

## 2021-10-18 DIAGNOSIS — R9431 Abnormal electrocardiogram [ECG] [EKG]: Secondary | ICD-10-CM

## 2021-10-18 NOTE — Patient Instructions (Signed)
Medication Instructions:  °Your physician recommends that you continue on your current medications as directed. Please refer to the Current Medication list given to you today.  °*If you need a refill on your cardiac medications before your next appointment, please call your pharmacy* ° ° °Lab Work: °None °If you have labs (blood work) drawn today and your tests are completely normal, you will receive your results only by: °MyChart Message (if you have MyChart) OR °A paper copy in the mail °If you have any lab test that is abnormal or we need to change your treatment, we will call you to review the results. ° ° °Testing/Procedures: °Your physician has requested that you have an echocardiogram. Echocardiography is a painless test that uses sound waves to create images of your heart. It provides your doctor with information about the size and shape of your heart and how well your heart’s chambers and valves are working. This procedure takes approximately one hour. There are no restrictions for this procedure. ° °ZIO XT- Long Term Monitor Instructions ° °Your physician has requested you wear a ZIO patch monitor for 14 days.  °This is a single patch monitor. [employer] supplies one patch monitor per enrollment. Additional °stickers are not available. Please do not apply patch if you will be having a Nuclear Stress Test,  °Echocardiogram, Cardiac CT, MRI, or Chest Xray during the period you would be wearing the  °monitor. The patch cannot be worn during these tests. You cannot remove and re-apply the  °ZIO XT patch monitor.  °Your ZIO patch monitor will be mailed 3 day USPS to your address on file. It may take 3-5 days  °to receive your monitor after you have been enrolled.  °Once you have received your monitor, please review the enclosed instructions. Your monitor  °has already been registered assigning a specific monitor serial # to you. ° °Billing and Patient Assistance Program Information ° °We have supplied [employer] with  any of your insurance information on file for billing purposes. °[employer] offers a sliding scale Patient Assistance Program for patients that do not have  °insurance, or whose insurance does not completely cover the cost of the ZIO monitor.  °You must apply for the Patient Assistance Program to qualify for this discounted rate.  °To apply, please call [employer] at [phone], select option 4, select option 2, ask to apply for  °Patient Assistance Program. [employer] will ask your household income, and how many people  °are in your household. They will quote your out-of-pocket cost based on that information.  °[employer] will also be able to set up a 12-month, interest-free payment plan if needed. ° °Applying the monitor °  °Shave hair from upper left chest.  °Hold abrader disc by orange tab. Rub abrader in 40 strokes over the upper left chest as  °indicated in your monitor instructions.  °Clean area with 4 enclosed alcohol pads. Let dry.  °Apply patch as indicated in monitor instructions. Patch will be placed under collarbone on left  °side of chest with arrow pointing upward.  °Rub patch adhesive wings for 2 minutes. Remove white label marked "1". Remove the white  °label marked "2". Rub patch adhesive wings for 2 additional minutes.  °While looking in a mirror, press and release button in center of patch. A small green light will  °flash 3-4 times. This will be your only indicator that the monitor has been turned on.  °Do not shower for the first 24 hours. You may shower after the first   24 hours.  °Press the button if you feel a symptom. You will hear a small click. Record Date, Time and  °Symptom in the Patient Logbook.  °When you are ready to remove the patch, follow instructions on the last 2 pages of Patient  °Logbook. Stick patch monitor onto the last page of Patient Logbook.  °Place Patient Logbook in the blue and white box. Use locking tab on box and tape box closed  °securely. The blue and white box has prepaid  postage on it. Please place it in the mailbox as  °soon as possible. Your physician should have your test results approximately 7 days after the  °monitor has been mailed back to [employer].  °Call [employer] Customer Care at [phone] if you have questions regarding  °your ZIO XT patch monitor. Call them immediately if you see an orange light blinking on your  °monitor.  °If your monitor falls off in less than 4 days, contact our Monitor department at [phone].  °If your monitor becomes loose or falls off after 4 days call [employer] at [phone] for  °suggestions on securing your monitor ° ° ° °Follow-Up: °At [facility], you and your health needs are our priority.  As part of our continuing mission to provide you with exceptional heart care, we have created designated Provider Care Teams.  These Care Teams include your primary Cardiologist (physician) and Advanced Practice Providers (APPs -  Physician Assistants and Nurse Practitioners) who all work together to provide you with the care you need, when you need it. ° °We recommend signing up for the patient portal called "MyChart".  Sign up information is provided on this After Visit Summary.  MyChart is used to connect with patients for Virtual Visits (Telemedicine).  Patients are able to view lab/test results, encounter notes, upcoming appointments, etc.  Non-urgent messages can be sent to your provider as well.   °To learn more about what you can do with MyChart, go to [email or web address].   ° °Your next appointment:   °4 month(s) ° °The format for your next appointment:   °In Person ° °Provider:   °Kardie Tobb, DO   ° ° °Other Instructions °  °

## 2021-10-18 NOTE — Progress Notes (Signed)
o

## 2021-10-18 NOTE — Progress Notes (Unsigned)
Enrolled for [employer] to mail a ZIO XT long term holter monitor to the patients address on file.  

## 2021-10-18 NOTE — Progress Notes (Signed)
Cardiology Office Note:    Date:  10/18/2021   ID:  Monique Wright, DOB [birth year], MRN [id number]  PCP:  Burnard Bunting, MD  Cardiologist:  Berniece Salines, DO  Electrophysiologist:  None   Referring MD: Burnard Bunting, MD   " I am experiencing ectopy"  History of Present Illness:    Monique Wright is a 85 y.o. female with a hx of Per chart she has a history of coronary artery sclerosis but the patient denied any history of any heart catheterization or coronary CT scan, she had a stress test back in 2012 which was normal, mild aortic stenosis on echocardiogram done in 2018, hypothyroidism and hypertension.  She tell that she has had progressive shortness of breath on exertion.  Recently is getting worse.  In addition she notes that she was told by her PCP that she has lots of PVCs but she does not notice this.  Her shortness of breath is her biggest problem.  No chest pain.  Past Medical History:  Diagnosis Date   Anemia    Arthritis    spinal arthritis   Coronary atherosclerosis of native coronary artery    pt denies having any heart problems and never seen the person who entered this in.   Fatigue    Hypertension    Hypothyroidism    Left ventricular systolic dysfunction    Plantar fasciitis    Scoliosis    Shortness of breath    on exertion    Past Surgical History:  Procedure Laterality Date   ABDOMINAL HYSTERECTOMY     tah and bso   APPENDECTOMY     ENTEROSCOPY N/A 12/21/2013   Procedure: ENTEROSCOPY;  Surgeon: Irene Shipper, MD;  Location: [facility];  Service: Endoscopy;  Laterality: N/A;   TONSILECTOMY, ADENOIDECTOMY, BILATERAL MYRINGOTOMY AND TUBES     torn cartlidge repair Right yrs ago    Current Medications: Current Meds  Medication Sig   COVID-19 mRNA bivalent vaccine, Pfizer, (PFIZER COVID-19 VAC BIVALENT) injection Inject into the muscle.   influenza vaccine adjuvanted (FLUAD QUADRIVALENT) 0.5 ML injection Inject into the muscle.   levothyroxine  (SYNTHROID, LEVOTHROID) 50 MCG tablet Take 1 tablet by mouth daily before breakfast.    LUMIGAN 0.01 % SOLN 1 drop at bedtime.   omeprazole (PRILOSEC) 40 MG capsule TAKE 1 CAPSULE BY MOUTH TWICE A DAY   psyllium (METAMUCIL) 58.6 % powder Take 1 packet by mouth daily.   traMADol (ULTRAM) 50 MG tablet Take 1 tablet (50 mg) by mouth once daily.   traZODone (DESYREL) 100 MG tablet Take 100 mg by mouth at bedtime.    valsartan-hydrochlorothiazide (DIOVAN-HCT) 320-25 MG per tablet Take 1 tablet by mouth every morning.      Allergies:   Penicillins and Pneumococcal vaccines   Social History   Socioeconomic History   Marital status: Married    Spouse name: Not on file   Number of children: Not on file   Years of education: Not on file   Highest education level: Not on file  Occupational History   Occupation: retired Programmer, multimedia: [employer]  Tobacco Use   Smoking status: Never   Smokeless tobacco: Never  Vaping Use   Vaping Use: Never used  Substance and Sexual Activity   Alcohol use: Yes    Comment: occasional   Drug use: No   Sexual activity: Never    Partners: Male  Other Topics Concern   Not on file  Social History Narrative  Not on file   Social Determinants of Health   Financial Resource Strain: Not on file  Food Insecurity: Not on file  Transportation Needs: Not on file  Physical Activity: Not on file  Stress: Not on file  Social Connections: Not on file     Family History: The patient's family history includes Heart attack in her mother; Lung cancer in her father. There is no history of Stomach cancer, Colon cancer, or Breast cancer.  ROS:   Review of Systems  Constitution: Negative for decreased appetite, fever and weight gain.  HENT: Negative for congestion, ear discharge, hoarse voice and sore throat.   Eyes: Negative for discharge, redness, vision loss in right eye and visual halos.  Cardiovascular: Negative for chest pain, dyspnea on exertion,  leg swelling, orthopnea and palpitations.  Respiratory: Negative for cough, hemoptysis, shortness of breath and snoring.   Endocrine: Negative for heat intolerance and polyphagia.  Hematologic/Lymphatic: Negative for bleeding problem. Does not bruise/bleed easily.  Skin: Negative for flushing, nail changes, rash and suspicious lesions.  Musculoskeletal: Negative for arthritis, joint pain, muscle cramps, myalgias, neck pain and stiffness.  Gastrointestinal: Negative for abdominal pain, bowel incontinence, diarrhea and excessive appetite.  Genitourinary: Negative for decreased libido, genital sores and incomplete emptying.  Neurological: Negative for brief paralysis, focal weakness, headaches and loss of balance.  Psychiatric/Behavioral: Negative for altered mental status, depression and suicidal ideas.  Allergic/Immunologic: Negative for HIV exposure and persistent infections.    EKGs/Labs/Other Studies Reviewed:    The following studies were reviewed today:   EKG:  The ekg ordered today demonstrates sinus rhythm, heart rate 80 bpm with poor R wave progression cannot rule out old anterior infarction.  Recent Labs: No results found for requested labs within last 8760 hours.  Recent Lipid Panel No results found for: CHOL, TRIG, HDL, CHOLHDL, VLDL, LDLCALC, LDLDIRECT  Physical Exam:    VS:  BP (!) 150/90 (BP Location: Right Arm)    Pulse 82    Ht 5\' 3"  (1.6 m)    Wt 140 lb 6.4 oz (63.7 kg)    SpO2 98%    BMI 24.87 kg/m     Wt Readings from Last 3 Encounters:  10/18/21 140 lb 6.4 oz (63.7 kg)  07/17/18 137 lb (62.1 kg)  09/10/17 135 lb (61.2 kg)     GEN: Well nourished, well developed in no acute distress HEENT: Normal NECK: No JVD; No carotid bruits LYMPHATICS: No lymphadenopathy CARDIAC: S1S2 noted,RRR, 3/6 Ejection murmur, rubs, gallops RESPIRATORY:  Clear to auscultation without rales, wheezing or rhonchi  ABDOMEN: Soft, non-tender, non-distended, +bowel sounds, no  guarding. EXTREMITIES: No edema, No cyanosis, no clubbing MUSCULOSKELETAL:  No deformity  SKIN: Warm and dry NEUROLOGIC:  Alert and oriented x 3, non-focal PSYCHIATRIC:  Normal affect, good insight  ASSESSMENT:    1. DOE (dyspnea on exertion)   2. PVC (premature ventricular contraction)   3. Nonrheumatic aortic (valve) stenosis   4. Nonspecific abnormal electrocardiogram (ECG) (EKG)   5. Hyperlipidemia, unspecified hyperlipidemia type   6. Hypertension, unspecified type    PLAN:    Physical exam suggesting aortic stenosis murmur she has no mild aortic stenosis with her progressive shortness of breath and like to repeat echocardiogram to assess the severity of her valvular disease now.    In addition with her symptoms if she does not have severe aortic stenosis I plan to move forward with a Lexiscan for her ischemic evaluation.  Her blood pressure is elevated in the office  today.  She has been usually this is not this time.  I reviewed her visit with her PCP which was on October 01, 2021 at that time her blood pressure is 133/78.  Of asked the patient to take her blood pressure daily as I would like to avoid sustained hypotension by increasing her antihypertensive medication with this isolated 1 elevated blood pressure reading.  Will place to monitor the patient to assess her PVC burden.  The patient is in agreement with the above plan. The patient left the office in stable condition.  The patient will follow up in 4 months or sooner if needed.   Medication Adjustments/Labs and Tests Ordered: Current medicines are reviewed at length with the patient today.  Concerns regarding medicines are outlined above.  Orders Placed This Encounter  Procedures   LONG TERM MONITOR (3-14 DAYS)   EKG 12-Lead   ECHOCARDIOGRAM COMPLETE   No orders of the defined types were placed in this encounter.   Patient Instructions  Medication Instructions:  Your physician recommends that you continue on  your current medications as directed. Please refer to the Current Medication list given to you today.  *If you need a refill on your cardiac medications before your next appointment, please call your pharmacy*   Lab Work: None If you have labs (blood work) drawn today and your tests are completely normal, you will receive your results only by: [place] (if you have MyChart) OR A paper copy in the mail If you have any lab test that is abnormal or we need to change your treatment, we will call you to review the results.   Testing/Procedures: Your physician has requested that you have an echocardiogram. Echocardiography is a painless test that uses sound waves to create images of your heart. It provides your doctor with information about the size and shape of your heart and how well your hearts chambers and valves are working. This procedure takes approximately one hour. There are no restrictions for this procedure.  ZIO XT- Long Term Monitor Instructions  Your physician has requested you wear a ZIO patch monitor for 14 days.  This is a single patch monitor. [employer] supplies one patch monitor per enrollment. Additional stickers are not available. Please do not apply patch if you will be having a Nuclear Stress Test,  Echocardiogram, Cardiac CT, MRI, or Chest Xray during the period you would be wearing the  monitor. The patch cannot be worn during these tests. You cannot remove and re-apply the  ZIO XT patch monitor.  Your ZIO patch monitor will be mailed 3 day USPS to your address on file. It may take 3-5 days  to receive your monitor after you have been enrolled.  Once you have received your monitor, please review the enclosed instructions. Your monitor  has already been registered assigning a specific monitor serial # to you.  Billing and Patient Assistance Program Information  We have supplied [employer] with any of your insurance information on file for billing purposes. [employer]  offers a sliding scale Patient Assistance Program for patients that do not have  insurance, or whose insurance does not completely cover the cost of the ZIO monitor.  You must apply for the Patient Assistance Program to qualify for this discounted rate.  To apply, please call [employer] at [phone], select option 4, select option 2, ask to apply for  Patient Assistance Program. Theodore Demark will ask your household income, and how many people  are in your household. They will  quote your out-of-pocket cost based on that information.  [employer] will also be able to set up a 71-month, interest-free payment plan if needed.  Applying the monitor   Shave hair from upper left chest.  Hold abrader disc by orange tab. Rub abrader in 40 strokes over the upper left chest as  indicated in your monitor instructions.  Clean area with 4 enclosed alcohol pads. Let dry.  Apply patch as indicated in monitor instructions. Patch will be placed under collarbone on left  side of chest with arrow pointing upward.  Rub patch adhesive wings for 2 minutes. Remove white label marked "1". Remove the white  label marked "2". Rub patch adhesive wings for 2 additional minutes.  While looking in a mirror, press and release button in center of patch. A small green light will  flash 3-4 times. This will be your only indicator that the monitor has been turned on.  Do not shower for the first 24 hours. You may shower after the first 24 hours.  Press the button if you feel a symptom. You will hear a small click. Record Date, Time and  Symptom in the Patient Logbook.  When you are ready to remove the patch, follow instructions on the last 2 pages of Patient  Logbook. Stick patch monitor onto the last page of Patient Logbook.  Place Patient Logbook in the blue and white box. Use locking tab on box and tape box closed  securely. The blue and white box has prepaid postage on it. Please place it in the mailbox as  soon as possible. Your  physician should have your test results approximately 7 days after the  monitor has been mailed back to [facility].  Call [employer] at [phone] if you have questions regarding  your ZIO XT patch monitor. Call them immediately if you see an orange light blinking on your  monitor.  If your monitor falls off in less than 4 days, contact our Monitor department at [phone].  If your monitor becomes loose or falls off after 4 days call [employer] at [phone] for  suggestions on securing your monitor    Follow-Up: At [facility], you and your health needs are our priority.  As part of our continuing mission to provide you with exceptional heart care, we have created designated Provider Care Teams.  These Care Teams include your primary Cardiologist (physician) and Advanced Practice Providers (APPs -  Physician Assistants and Nurse Practitioners) who all work together to provide you with the care you need, when you need it.  We recommend signing up for the patient portal called "MyChart".  Sign up information is provided on this After Visit Summary.  MyChart is used to connect with patients for Virtual Visits (Telemedicine).  Patients are able to view lab/test results, encounter notes, upcoming appointments, etc.  Non-urgent messages can be sent to your provider as well.   To learn more about what you can do with MyChart, go to [email or web address].    Your next appointment:   4 month(s)  The format for your next appointment:   In Person  Provider:   Berniece Salines, DO     Other Instructions     Adopting a Healthy Lifestyle.  Know what a healthy weight is for you (roughly BMI <25) and aim to maintain this   Aim for 7+ servings of fruits and vegetables daily   65-80+ fluid ounces of water or unsweet tea for healthy kidneys   Limit to max  1 drink of alcohol per day; avoid smoking/tobacco   Limit animal fats in diet for cholesterol and heart health -  choose grass fed whenever available   Avoid highly processed foods, and foods high in saturated/trans fats   Aim for low stress - take time to unwind and care for your mental health   Aim for 150 min of moderate intensity exercise weekly for heart health, and weights twice weekly for bone health   Aim for 7-9 hours of sleep daily   When it comes to diets, agreement about the perfect plan isnt easy to find, even among the experts. Experts at the [employer] developed an idea known as the Healthy Eating Plate. Just imagine a plate divided into logical, healthy portions.   The emphasis is on diet quality:   Load up on vegetables and fruits - one-half of your plate: Aim for color and variety, and remember that potatoes dont count.   Go for whole grains - one-quarter of your plate: Whole wheat, barley, wheat berries, quinoa, oats, brown rice, and foods made with them. If you want pasta, go with whole wheat pasta.   Protein power - one-quarter of your plate: Fish, chicken, beans, and nuts are all healthy, versatile protein sources. Limit red meat.   The diet, however, does go beyond the plate, offering a few other suggestions.   Use healthy plant oils, such as olive, canola, soy, corn, sunflower and peanut. Check the labels, and avoid partially hydrogenated oil, which have unhealthy trans fats.   If youre thirsty, drink water. Coffee and tea are good in moderation, but skip sugary drinks and limit milk and dairy products to one or two daily servings.   The type of carbohydrate in the diet is more important than the amount. Some sources of carbohydrates, such as vegetables, fruits, whole grains, and beans-are healthier than others.   Finally, stay active  Signed, Berniece Salines, DO  10/18/2021 1:23 PM    [facility]

## 2021-10-25 DIAGNOSIS — I493 Ventricular premature depolarization: Secondary | ICD-10-CM

## 2021-10-31 ENCOUNTER — Other Ambulatory Visit (HOSPITAL_BASED_OUTPATIENT_CLINIC_OR_DEPARTMENT_OTHER): Payer: Self-pay

## 2021-11-09 ENCOUNTER — Other Ambulatory Visit: Payer: Self-pay

## 2021-11-09 ENCOUNTER — Ambulatory Visit (HOSPITAL_COMMUNITY): Payer: Medicare PPO | Attending: Cardiovascular Disease

## 2021-11-09 DIAGNOSIS — I350 Nonrheumatic aortic (valve) stenosis: Secondary | ICD-10-CM | POA: Insufficient documentation

## 2021-11-09 LAB — ECHOCARDIOGRAM COMPLETE
AR max vel: 1.2 cm2
AV Area VTI: 1.1 cm2
AV Area mean vel: 1.04 cm2
AV Mean grad: 10.3 mmHg
AV Peak grad: 18.7 mmHg
Ao pk vel: 2.16 m/s
Area-P 1/2: 3.43 cm2
P 1/2 time: 927 msec
S' Lateral: 2 cm

## 2021-11-15 ENCOUNTER — Other Ambulatory Visit: Payer: Self-pay

## 2021-11-15 ENCOUNTER — Other Ambulatory Visit: Payer: Self-pay | Admitting: Internal Medicine

## 2021-11-15 ENCOUNTER — Encounter: Payer: Self-pay | Admitting: Cardiology

## 2021-11-15 ENCOUNTER — Ambulatory Visit: Payer: Medicare PPO | Admitting: Cardiology

## 2021-11-15 VITALS — BP 130/82 | HR 87 | Ht 62.0 in | Wt 141.4 lb

## 2021-11-15 DIAGNOSIS — Z1231 Encounter for screening mammogram for malignant neoplasm of breast: Secondary | ICD-10-CM

## 2021-11-15 DIAGNOSIS — I471 Supraventricular tachycardia: Secondary | ICD-10-CM

## 2021-11-15 DIAGNOSIS — I351 Nonrheumatic aortic (valve) insufficiency: Secondary | ICD-10-CM | POA: Diagnosis not present

## 2021-11-15 DIAGNOSIS — I350 Nonrheumatic aortic (valve) stenosis: Secondary | ICD-10-CM | POA: Diagnosis not present

## 2021-11-15 DIAGNOSIS — I493 Ventricular premature depolarization: Secondary | ICD-10-CM

## 2021-11-15 DIAGNOSIS — R0602 Shortness of breath: Secondary | ICD-10-CM

## 2021-11-15 MED ORDER — METOPROLOL SUCCINATE ER 25 MG PO TB24: 12.5000 mg | ORAL_TABLET | Freq: Every day | ORAL | 3 refills | Status: DC

## 2021-11-15 MED ORDER — METOPROLOL SUCCINATE ER 25 MG PO TB24: 12.5000 mg | ORAL_TABLET | Freq: Every evening | ORAL | 3 refills | Status: DC

## 2021-11-15 NOTE — Patient Instructions (Signed)
Medication Instructions:  Your physician has recommended you make the following change in your medication:  START: Metoprolol succinate (Toprol-XL) 12.5 mg nightly *If you need a refill on your cardiac medications before your next appointment, please call your pharmacy*   Lab Work: None If you have labs (blood work) drawn today and your tests are completely normal, you will receive your results only by: [place] (if you have MyChart) OR A paper copy in the mail If you have any lab test that is abnormal or we need to change your treatment, we will call you to review the results.   Testing/Procedures: None   Follow-Up: At [facility], you and your health needs are our priority.  As part of our continuing mission to provide you with exceptional heart care, we have created designated Provider Care Teams.  These Care Teams include your primary Cardiologist (physician) and Advanced Practice Providers (APPs -  Physician Assistants and Nurse Practitioners) who all work together to provide you with the care you need, when you need it.  We recommend signing up for the patient portal called "MyChart".  Sign up information is provided on this After Visit Summary.  MyChart is used to connect with patients for Virtual Visits (Telemedicine).  Patients are able to view lab/test results, encounter notes, upcoming appointments, etc.  Non-urgent messages can be sent to your provider as well.   To learn more about what you can do with MyChart, go to [email or web address].    Your next appointment:   12 week(s)  The format for your next appointment:   In Person  Provider:   Berniece Salines, DO     Other Instructions

## 2021-11-16 NOTE — Progress Notes (Signed)
Cardiology Office Note:    Date:  11/16/2021   ID:  Monique Wright, DOB 01/21/1936, MRN [id number]  PCP:  Burnard Bunting, MD  Cardiologist:  Berniece Salines, DO  Electrophysiologist:  None   Referring MD: Burnard Bunting, MD   " I am still short of breath with the palpations"  History of Present Illness:    Monique Wright is a 86 y.o. female with a hx of aortic stenosis reporting mild aortic stenosis on echo done in 2018, hypothyroidism and hypertension is here today for follow-up visit.  I first saw the patient on October 18, 2021 at that time she presented for significant shortness of breath on exertion as well as lots of PVCs.  2 days her PCP recommended she see cardiology.  During our initial visit I advised the patient to get an echocardiogram as well I placed a monitor heart understand the PVC burden and reassess her aortic valve.  She was able to get this testing done.  She is here today for follow-up visit.  Past Medical History:  Diagnosis Date   Anemia    Arthritis    spinal arthritis   Coronary atherosclerosis of native coronary artery    pt denies having any heart problems and never seen the person who entered this in.   Fatigue    Hypertension    Hypothyroidism    Left ventricular systolic dysfunction    Plantar fasciitis    Scoliosis    Shortness of breath    on exertion    Past Surgical History:  Procedure Laterality Date   ABDOMINAL HYSTERECTOMY     tah and bso   APPENDECTOMY     ENTEROSCOPY N/A 12/21/2013   Procedure: ENTEROSCOPY;  Surgeon: Irene Shipper, MD;  Location: [facility];  Service: Endoscopy;  Laterality: N/A;   TONSILECTOMY, ADENOIDECTOMY, BILATERAL MYRINGOTOMY AND TUBES     torn cartlidge repair Right yrs ago    Current Medications: Current Meds  Medication Sig   Cholecalciferol (VITAMIN D3) 50 MCG (2000 UT) CAPS Take 50 mcg by mouth daily.   COVID-19 mRNA bivalent vaccine, Pfizer, (PFIZER COVID-19 VAC BIVALENT) injection Inject  into the muscle.   influenza vaccine adjuvanted (FLUAD QUADRIVALENT) 0.5 ML injection Inject into the muscle.   levothyroxine (SYNTHROID, LEVOTHROID) 50 MCG tablet Take 1 tablet by mouth daily before breakfast.    LUMIGAN 0.01 % SOLN 1 drop at bedtime.   omeprazole (PRILOSEC) 40 MG capsule TAKE 1 CAPSULE BY MOUTH TWICE A DAY   psyllium (METAMUCIL) 58.6 % powder Take 1 packet by mouth daily.   traMADol (ULTRAM) 50 MG tablet Take 1 tablet (50 mg) by mouth once daily.   traZODone (DESYREL) 100 MG tablet Take 100 mg by mouth at bedtime.    valsartan-hydrochlorothiazide (DIOVAN-HCT) 320-25 MG per tablet Take 1 tablet by mouth every morning.    [DISCONTINUED] metoprolol succinate (TOPROL XL) 25 MG 24 hr tablet Take 0.5 tablets (12.5 mg total) by mouth daily.     Allergies:   Penicillins, Pneumococcal vac polyvalent, Pneumococcal vaccines, and Prevnar 13 [pneumococcal 13-val conj vacc]   Social History   Socioeconomic History   Marital status: Married    Spouse name: Not on file   Number of children: Not on file   Years of education: Not on file   Highest education level: Not on file  Occupational History   Occupation: retired Programmer, multimedia: [employer]  Tobacco Use   Smoking status: Never   Smokeless tobacco:  Never  Vaping Use   Vaping Use: Never used  Substance and Sexual Activity   Alcohol use: Yes    Comment: occasional   Drug use: No   Sexual activity: Never    Partners: Male  Other Topics Concern   Not on file  Social History Narrative   Not on file   Social Determinants of Health   Financial Resource Strain: Not on file  Food Insecurity: Not on file  Transportation Needs: Not on file  Physical Activity: Not on file  Stress: Not on file  Social Connections: Not on file     Family History: The patient's family history includes Heart attack in her mother; Lung cancer in her father. There is no history of Stomach cancer, Colon cancer, or Breast  cancer.  ROS:   Review of Systems  Constitution: Negative for decreased appetite, fever and weight gain.  HENT: Negative for congestion, ear discharge, hoarse voice and sore throat.   Eyes: Negative for discharge, redness, vision loss in right eye and visual halos.  Cardiovascular: Reports dyspnea on exertion and palpitation.  Negative for chest pain, leg swelling, orthopnea. Respiratory: Negative for cough, hemoptysis, shortness of breath and snoring.   Endocrine: Negative for heat intolerance and polyphagia.  Hematologic/Lymphatic: Negative for bleeding problem. Does not bruise/bleed easily.  Skin: Negative for flushing, nail changes, rash and suspicious lesions.  Musculoskeletal: Negative for arthritis, joint pain, muscle cramps, myalgias, neck pain and stiffness.  Gastrointestinal: Negative for abdominal pain, bowel incontinence, diarrhea and excessive appetite.  Genitourinary: Negative for decreased libido, genital sores and incomplete emptying.  Neurological: Negative for brief paralysis, focal weakness, headaches and loss of balance.  Psychiatric/Behavioral: Negative for altered mental status, depression and suicidal ideas.  Allergic/Immunologic: Negative for HIV exposure and persistent infections.    EKGs/Labs/Other Studies Reviewed:    The following studies were reviewed today:   EKG: None today  TTE 11/09/2021 IMPRESSIONS     1. Left ventricular ejection fraction, by estimation, is 60 to 65%. The left ventricle has normal function. The left ventricle has no regional wall motion abnormalities. Left ventricular diastolic parameters are  consistent with Grade I diastolic dysfunction (impaired relaxation).   2. Right ventricular systolic function is normal. The right ventricular size is normal.   3. The pericardial effusion is posterior to the left ventricle.   4. The mitral valve is abnormal. Mild mitral valve regurgitation. No evidence of mitral stenosis.   5. The aortic  valve is calcified. There is moderate calcification of the aortic valve. Aortic valve regurgitation is moderate. Mild to moderate aortic valve stenosis.   6. The inferior vena cava is normal in size with greater than 50% respiratory variability, suggesting right atrial pressure of 3 mmHg.   FINDINGS   Left Ventricle: Left ventricular ejection fraction, by estimation, is 60  to 65%. The left ventricle has normal function. The left ventricle has no  regional wall motion abnormalities. The left ventricular internal cavity  size was normal in size. There is   no left ventricular hypertrophy. Left ventricular diastolic parameters  are consistent with Grade I diastolic dysfunction (impaired relaxation).   Right Ventricle: The right ventricular size is normal. No increase in  right ventricular wall thickness. Right ventricular systolic function is  normal.   Left Atrium: Left atrial size was normal in size.   Right Atrium: Right atrial size was normal in size.   Pericardium: Trivial pericardial effusion is present. The pericardial  effusion is posterior to the left  ventricle.   Mitral Valve: The mitral valve is abnormal. There is mild thickening of  the mitral valve leaflet(s). There is mild calcification of the mitral  valve leaflet(s). Mild mitral valve regurgitation. No evidence of mitral  valve stenosis.   Tricuspid Valve: The tricuspid valve is normal in structure. Tricuspid  valve regurgitation is mild . No evidence of tricuspid stenosis.   Aortic Valve: The aortic valve is calcified. There is moderate  calcification of the aortic valve. Aortic valve regurgitation is moderate.  Aortic regurgitation PHT measures 927 msec. Mild to moderate aortic  stenosis is present. Aortic valve mean gradient  measures 10.3 mmHg. Aortic valve peak gradient measures 18.7 mmHg. Aortic  valve area, by VTI measures 1.10 cm.   Pulmonic Valve: The pulmonic valve was normal in structure. Pulmonic valve   regurgitation is trivial. No evidence of pulmonic stenosis.   Aorta: The aortic root is normal in size and structure.   Venous: The inferior vena cava is normal in size with greater than 50%  respiratory variability, suggesting right atrial pressure of 3 mmHg.   IAS/Shunts: No atrial level shunt detected by color flow Doppler.    Zio monitor    Patch Wear Time:  10 days and 9 hours (2023-01-05T10:25:46-0500 to 2023-01-15T20:15:44-0500)   Indication: Premature ventricular complexes.   Patient had a min HR of 55 bpm, max HR of 190 bpm, and avg HR of 78 bpm. Predominant underlying rhythm was Sinus Rhythm.    442 Supraventricular Tachycardia runs occurred, the run with the fastest interval lasting 4 beats with a max rate of 190 bpm, the  longest lasting 33.0 secs with an avg rate of 144 bpm. Supraventricular Tachycardia was detected within +/- 45 seconds of symptomatic patient event(s). Isolated SVEs were occasional (1.3%, 14444), SVE Couplets were rare (<1.0%, 1342), and SVE Triplets  were rare (<1.0%, 405). Isolated VEs were rare (<1.0%), and no VE Couplets or VE Triplets were present.   Symptoms associated with supraventricular tachycardia and premature ventricular complex.     Conclusion: This study is remarkable for the following:                                  1.  Symptomatic paroxysmal supraventricular tachycardia.                                   2.  Symptomatic occasional premature atrial complexes  Recent Labs: No results found for requested labs within last 8760 hours.  Recent Lipid Panel No results found for: CHOL, TRIG, HDL, CHOLHDL, VLDL, LDLCALC, LDLDIRECT  Physical Exam:    VS:  BP 130/82 (BP Location: Left Arm, Patient Position: Sitting)    Pulse 87    Ht 5\' 2"  (1.575 m)    Wt 141 lb 6.4 oz (64.1 kg)    SpO2 99%    BMI 25.86 kg/m     Wt Readings from Last 3 Encounters:  11/15/21 141 lb 6.4 oz (64.1 kg)  10/18/21 140 lb 6.4 oz (63.7 kg)  07/17/18 137 lb  (62.1 kg)     GEN: Well nourished, well developed in no acute distress HEENT: Normal NECK: No JVD; No carotid bruits LYMPHATICS: No lymphadenopathy CARDIAC: S1S2 noted,RRR, 2/6 midsystolic ejection murmurs, rubs, gallops RESPIRATORY:  Clear to auscultation without rales, wheezing or rhonchi  ABDOMEN: Soft, non-tender, non-distended, +bowel sounds, no  guarding. EXTREMITIES: No edema, No cyanosis, no clubbing MUSCULOSKELETAL:  No deformity  SKIN: Warm and dry NEUROLOGIC:  Alert and oriented x 3, non-focal PSYCHIATRIC:  Normal affect, good insight  ASSESSMENT:    1. PAT (paroxysmal atrial tachycardia) ([place])   2. PVC (premature ventricular contraction)   3. Nonrheumatic aortic (valve) stenosis   4. Nonrheumatic aortic valve regurgitation    PLAN:    She is still symptomatic with shortness of breath.  She tells me though mostly it is occurring when she experienced the palpitations.  She is still has not had any chest discomfort.  I discussed her monitor results with her which show evidence of symptomatic paroxysmal atrial tachycardia as well as symptomatic PVCs.  I will start the patient on low-dose beta-blocker Toprol-XL 12.5 mg daily hoping that this will help with improving her symptoms.  She will notify my office in the next 2 to 3 weeks if she has experienced any type of improvements in there is room to increase her beta-blocker.  We talked about the echocardiogram results which show mild to moderate aortic stenosis as well as moderate aortic regurgitation.  It will be beneficial to repeat her echo in 6 months if she continues to have symptoms.  As well as considering ischemic evaluation.  She is in agreement with the above plan.  The patient is in agreement with the above plan. The patient left the office in stable condition.  The patient will follow up in 12 weeks.   Medication Adjustments/Labs and Tests Ordered: Current medicines are reviewed at length with the patient today.   Concerns regarding medicines are outlined above.  No orders of the defined types were placed in this encounter.  Meds ordered this encounter  Medications   DISCONTD: metoprolol succinate (TOPROL XL) 25 MG 24 hr tablet    Sig: Take 0.5 tablets (12.5 mg total) by mouth daily.    Dispense:  45 tablet    Refill:  3   metoprolol succinate (TOPROL XL) 25 MG 24 hr tablet    Sig: Take 0.5 tablets (12.5 mg total) by mouth at bedtime.    Dispense:  45 tablet    Refill:  3    Patient Instructions  Medication Instructions:  Your physician has recommended you make the following change in your medication:  START: Metoprolol succinate (Toprol-XL) 12.5 mg nightly *If you need a refill on your cardiac medications before your next appointment, please call your pharmacy*   Lab Work: None If you have labs (blood work) drawn today and your tests are completely normal, you will receive your results only by: [place] (if you have MyChart) OR A paper copy in the mail If you have any lab test that is abnormal or we need to change your treatment, we will call you to review the results.   Testing/Procedures: None   Follow-Up: At [facility], you and your health needs are our priority.  As part of our continuing mission to provide you with exceptional heart care, we have created designated Provider Care Teams.  These Care Teams include your primary Cardiologist (physician) and Advanced Practice Providers (APPs -  Physician Assistants and Nurse Practitioners) who all work together to provide you with the care you need, when you need it.  We recommend signing up for the patient portal called "MyChart".  Sign up information is provided on this After Visit Summary.  MyChart is used to connect with patients for Virtual Visits (Telemedicine).  Patients are able to view  lab/test results, encounter notes, upcoming appointments, etc.  Non-urgent messages can be sent to your provider as well.   To learn  more about what you can do with MyChart, go to [email or web address].    Your next appointment:   12 week(s)  The format for your next appointment:   In Person  Provider:   Berniece Salines, DO     Other Instructions     Adopting a Healthy Lifestyle.  Know what a healthy weight is for you (roughly BMI <25) and aim to maintain this   Aim for 7+ servings of fruits and vegetables daily   65-80+ fluid ounces of water or unsweet tea for healthy kidneys   Limit to max 1 drink of alcohol per day; avoid smoking/tobacco   Limit animal fats in diet for cholesterol and heart health - choose grass fed whenever available   Avoid highly processed foods, and foods high in saturated/trans fats   Aim for low stress - take time to unwind and care for your mental health   Aim for 150 min of moderate intensity exercise weekly for heart health, and weights twice weekly for bone health   Aim for 7-9 hours of sleep daily   When it comes to diets, agreement about the perfect plan isnt easy to find, even among the experts. Experts at the [employer] developed an idea known as the Healthy Eating Plate. Just imagine a plate divided into logical, healthy portions.   The emphasis is on diet quality:   Load up on vegetables and fruits - one-half of your plate: Aim for color and variety, and remember that potatoes dont count.   Go for whole grains - one-quarter of your plate: Whole wheat, barley, wheat berries, quinoa, oats, brown rice, and foods made with them. If you want pasta, go with whole wheat pasta.   Protein power - one-quarter of your plate: Fish, chicken, beans, and nuts are all healthy, versatile protein sources. Limit red meat.   The diet, however, does go beyond the plate, offering a few other suggestions.   Use healthy plant oils, such as olive, canola, soy, corn, sunflower and peanut. Check the labels, and avoid partially hydrogenated oil, which have unhealthy  trans fats.   If youre thirsty, drink water. Coffee and tea are good in moderation, but skip sugary drinks and limit milk and dairy products to one or two daily servings.   The type of carbohydrate in the diet is more important than the amount. Some sources of carbohydrates, such as vegetables, fruits, whole grains, and beans-are healthier than others.   Finally, stay active  Signed, Berniece Salines, DO  11/16/2021 9:26 AM    [facility]

## 2021-12-14 ENCOUNTER — Other Ambulatory Visit (HOSPITAL_BASED_OUTPATIENT_CLINIC_OR_DEPARTMENT_OTHER): Payer: Self-pay

## 2021-12-24 ENCOUNTER — Ambulatory Visit
Admission: RE | Admit: 2021-12-24 | Discharge: 2021-12-24 | Disposition: A | Discharge status: Home / Self Care | Payer: Medicare PPO | Source: Ambulatory Visit | Attending: Internal Medicine | Admitting: Internal Medicine

## 2021-12-24 ENCOUNTER — Other Ambulatory Visit: Payer: Self-pay

## 2021-12-24 DIAGNOSIS — Z1231 Encounter for screening mammogram for malignant neoplasm of breast: Secondary | ICD-10-CM

## 2022-01-31 ENCOUNTER — Other Ambulatory Visit (HOSPITAL_BASED_OUTPATIENT_CLINIC_OR_DEPARTMENT_OTHER): Payer: Self-pay

## 2022-01-31 MED ORDER — PREDNISONE 20 MG PO TABS
ORAL_TABLET | ORAL | 0 refills | Status: DC
  Filled 2022-01-31: qty 5, 5d supply, fill #0

## 2022-01-31 MED ORDER — HYDROCODONE BIT-HOMATROP MBR 5-1.5 MG/5ML PO SOLN
ORAL | 0 refills | Status: DC
  Filled 2022-01-31: qty 100, 5d supply, fill #0

## 2022-01-31 MED ORDER — AZITHROMYCIN 250 MG PO TABS
ORAL_TABLET | ORAL | 0 refills | Status: DC
  Filled 2022-01-31: qty 6, 5d supply, fill #0

## 2022-02-06 ENCOUNTER — Encounter: Payer: Self-pay | Admitting: Cardiology

## 2022-02-06 ENCOUNTER — Telehealth: Payer: Self-pay

## 2022-02-06 ENCOUNTER — Ambulatory Visit: Payer: Medicare PPO | Admitting: Cardiology

## 2022-02-06 VITALS — BP 136/81 | HR 73 | Ht 62.0 in | Wt 140.2 lb

## 2022-02-06 DIAGNOSIS — I351 Nonrheumatic aortic (valve) insufficiency: Secondary | ICD-10-CM | POA: Diagnosis not present

## 2022-02-06 DIAGNOSIS — I340 Nonrheumatic mitral (valve) insufficiency: Secondary | ICD-10-CM | POA: Diagnosis not present

## 2022-02-06 DIAGNOSIS — I350 Nonrheumatic aortic (valve) stenosis: Secondary | ICD-10-CM | POA: Diagnosis not present

## 2022-02-06 DIAGNOSIS — I491 Atrial premature depolarization: Secondary | ICD-10-CM

## 2022-02-06 DIAGNOSIS — I471 Supraventricular tachycardia: Secondary | ICD-10-CM | POA: Diagnosis not present

## 2022-02-06 NOTE — Progress Notes (Signed)
?Cardiology Office Note:   ? ?Date:  02/06/2022  ? ?ID:  Monique Wright, DOB 11-19-1935, MRN [id number] ? ?PCP:  Burnard Bunting, MD  ?Cardiologist:  Berniece Salines, DO  ?Electrophysiologist:  None  ? ?Referring MD: Burnard Bunting, MD  ? ?" I am doing well" ? ?History of Present Illness:   ? ?Monique Wright is a 86 y.o. female with a hx of  ? ? ? ?Past Medical History:  ?Diagnosis Date  ? Anemia   ? Arthritis   ? spinal arthritis  ? Coronary atherosclerosis of native coronary artery   ? pt denies having any heart problems and never seen the person who entered this in.  ? Fatigue   ? Hypertension   ? Hypothyroidism   ? Left ventricular systolic dysfunction   ? Plantar fasciitis   ? Scoliosis   ? Shortness of breath   ? on exertion  ? ? ?Past Surgical History:  ?Procedure Laterality Date  ? ABDOMINAL HYSTERECTOMY    ? tah and bso  ? APPENDECTOMY    ? ENTEROSCOPY N/A 12/21/2013  ? Procedure: ENTEROSCOPY;  Surgeon: Irene Shipper, MD;  Location: [facility];  Service: Endoscopy;  Laterality: N/A;  ? TONSILECTOMY, ADENOIDECTOMY, BILATERAL MYRINGOTOMY AND TUBES    ? torn cartlidge repair Right yrs ago  ? ? ?Current Medications: ?Current Meds  ?Medication Sig  ? Cholecalciferol (VITAMIN D3) 50 MCG (2000 UT) CAPS Take 50 mcg by mouth daily.  ? levothyroxine (SYNTHROID, LEVOTHROID) 50 MCG tablet Take 1 tablet by mouth daily before breakfast.   ? LUMIGAN 0.01 % SOLN 1 drop at bedtime.  ? metoprolol succinate (TOPROL XL) 25 MG 24 hr tablet Take 0.5 tablets (12.5 mg total) by mouth at bedtime.  ? omeprazole (PRILOSEC) 40 MG capsule TAKE 1 CAPSULE BY MOUTH TWICE A DAY  ? psyllium (METAMUCIL) 58.6 % powder Take 1 packet by mouth daily.  ? traMADol (ULTRAM) 50 MG tablet Take 1 tablet (50 mg) by mouth once daily.  ? traZODone (DESYREL) 100 MG tablet Take 100 mg by mouth at bedtime.   ? valsartan-hydrochlorothiazide (DIOVAN-HCT) 320-25 MG per tablet Take 1 tablet by mouth every morning.   ?  ? ?Allergies:   Penicillins,  Pneumococcal vac polyvalent, Pneumococcal vaccines, and Prevnar 13 [pneumococcal 13-val conj vacc]  ? ?Social History  ? ?Socioeconomic History  ? Marital status: Married  ?  Spouse name: Not on file  ? Number of children: Not on file  ? Years of education: Not on file  ? Highest education level: Not on file  ?Occupational History  ? Occupation: retired Therapist, sports  ?  Employer: [employer]  ?Tobacco Use  ? Smoking status: Never  ? Smokeless tobacco: Never  ?Vaping Use  ? Vaping Use: Never used  ?Substance and Sexual Activity  ? Alcohol use: Yes  ?  Comment: occasional  ? Drug use: No  ? Sexual activity: Never  ?  Partners: Male  ?Other Topics Concern  ? Not on file  ?Social History Narrative  ? Not on file  ? ?Social Determinants of Health  ? ?Financial Resource Strain: Not on file  ?Food Insecurity: Not on file  ?Transportation Needs: Not on file  ?Physical Activity: Not on file  ?Stress: Not on file  ?Social Connections: Not on file  ?  ? ?Family History: ?The patient's family history includes Heart attack in her mother; Lung cancer in her father. There is no history of Stomach cancer, Colon cancer, or Breast  cancer. ? ?ROS:   ?Review of Systems  ?Constitution: Negative for decreased appetite, fever and weight gain.  ?HENT: Negative for congestion, ear discharge, hoarse voice and sore throat.   ?Eyes: Negative for discharge, redness, vision loss in right eye and visual halos.  ?Cardiovascular: Negative for chest pain, dyspnea on exertion, leg swelling, orthopnea and palpitations.  ?Respiratory: Negative for cough, hemoptysis, shortness of breath and snoring.   ?Endocrine: Negative for heat intolerance and polyphagia.  ?Hematologic/Lymphatic: Negative for bleeding problem. Does not bruise/bleed easily.  ?Skin: Negative for flushing, nail changes, rash and suspicious lesions.  ?Musculoskeletal: Negative for arthritis, joint pain, muscle cramps, myalgias, neck pain and stiffness.  ?Gastrointestinal: Negative for  abdominal pain, bowel incontinence, diarrhea and excessive appetite.  ?Genitourinary: Negative for decreased libido, genital sores and incomplete emptying.  ?Neurological: Negative for brief paralysis, focal weakness, headaches and loss of balance.  ?Psychiatric/Behavioral: Negative for altered mental status, depression and suicidal ideas.  ?Allergic/Immunologic: Negative for HIV exposure and persistent infections.  ? ? ?EKGs/Labs/Other Studies Reviewed:   ? ?The following studies were reviewed today: ? ? ?EKG:  The ekg ordered today demonstrates  ? ?TTE 11/09/2021 ?IMPRESSIONS  ? ? ? 1. Left ventricular ejection fraction, by estimation, is 60 to 65%. The  ?left ventricle has normal function. The left ventricle has no regional  ?wall motion abnormalities. Left ventricular diastolic parameters are  ?consistent with Grade I diastolic  ?dysfunction (impaired relaxation).  ? 2. Right ventricular systolic function is normal. The right ventricular  ?size is normal.  ? 3. The pericardial effusion is posterior to the left ventricle.  ? 4. The mitral valve is abnormal. Mild mitral valve regurgitation. No  ?evidence of mitral stenosis.  ? 5. The aortic valve is calcified. There is moderate calcification of the  ?aortic valve. Aortic valve regurgitation is moderate. Mild to moderate  ?aortic valve stenosis.  ? 6. The inferior vena cava is normal in size with greater than 50%  ?respiratory variability, suggesting right atrial pressure of 3 mmHg.  ? ?FINDINGS  ? Left Ventricle: Left ventricular ejection fraction, by estimation, is 60  ?to 65%. The left ventricle has normal function. The left ventricle has no  ?regional wall motion abnormalities. The left ventricular internal cavity  ?size was normal in size. There is  ? no left ventricular hypertrophy. Left ventricular diastolic parameters  ?are consistent with Grade I diastolic dysfunction (impaired relaxation).  ? ?Right Ventricle: The right ventricular size is normal. No  increase in  ?right ventricular wall thickness. Right ventricular systolic function is  ?normal.  ? ?Left Atrium: Left atrial size was normal in size.  ? ?Right Atrium: Right atrial size was normal in size.  ? ?Pericardium: Trivial pericardial effusion is present. The pericardial effusion is posterior to the left ventricle.  ? ?Mitral Valve: The mitral valve is abnormal. There is mild thickening of the mitral valve leaflet(s). There is mild calcification of the mitral valve leaflet(s). Mild mitral valve regurgitation. No evidence of mitral valve stenosis.  ? ?Tricuspid Valve: The tricuspid valve is normal in structure. Tricuspid valve regurgitation is mild . No evidence of tricuspid stenosis.  ? ?Aortic Valve: The aortic valve is calcified. There is moderate calcification of the aortic valve. Aortic valve regurgitation is moderate. Aortic regurgitation PHT measures 927 msec. Mild to moderate aortic  ?stenosis is present. Aortic valve mean gradient  ?measures 10.3 mmHg. Aortic valve peak gradient measures 18.7 mmHg. Aortic  ?valve area, by VTI measures 1.10 cm?.  ? ?  Pulmonic Valve: The pulmonic valve was normal in structure. Pulmonic valve  ?regurgitation is trivial. No evidence of pulmonic stenosis.  ? ?Aorta: The aortic root is normal in size and structure.  ? ?Venous: The inferior vena cava is normal in size with greater than 50%  ?respiratory variability, suggesting right atrial pressure of 3 mmHg.  ? ?IAS/Shunts: No atrial level shunt detected by color flow Doppler.  ? ?   ?Zio monitor  ?Patch Wear Time:  10 days and 9 hours (2023-01-05T10:25:46-0500 to 2023-01-15T20:15:44-0500) ?  ?Indication: Premature ventricular complexes. ?  ?Patient had a min HR of 55 bpm, max HR of 190 bpm, and avg HR of 78 bpm. Predominant underlying rhythm was Sinus Rhythm.  ?  ?442 Supraventricular Tachycardia runs occurred, the run with the fastest interval lasting 4 beats with a max rate of 190 bpm, the  ?longest lasting 33.0 secs  with an avg rate of 144 bpm. Supraventricular Tachycardia was detected within +/- 45 seconds of symptomatic patient event(s). Isolated SVEs were occasional (1.3%, 14444), SVE Couplets were rare (<1.0%, 1342), and SVE Tr

## 2022-02-06 NOTE — Telephone Encounter (Signed)
Called pt to see if she could come in sooner today. No voicemail set up, unable to leave a message.  ?

## 2022-02-06 NOTE — Patient Instructions (Signed)
Medication Instructions:  ?Your physician recommends that you continue on your current medications as directed. Please refer to the Current Medication list given to you today.  ?*If you need a refill on your cardiac medications before your next appointment, please call your pharmacy* ? ? ?Lab Work: ?None ?If you have labs (blood work) drawn today and your tests are completely normal, you will receive your results only by: ?MyChart Message (if you have MyChart) OR ?A paper copy in the mail ?If you have any lab test that is abnormal or we need to change your treatment, we will call you to review the results. ? ? ?Testing/Procedures: ?Your physician has requested that you have an echocardiogram. Echocardiography is a painless test that uses sound waves to create images of your heart. It provides your doctor with information about the size and shape of your heart and how well your heart?s chambers and valves are working. This procedure takes approximately one hour. There are no restrictions for this procedure. ? ? ? ?Follow-Up: ?At [facility], you and your health needs are our priority.  As part of our continuing mission to provide you with exceptional heart care, we have created designated Provider Care Teams.  These Care Teams include your primary Cardiologist (physician) and Advanced Practice Providers (APPs -  Physician Assistants and Nurse Practitioners) who all work together to provide you with the care you need, when you need it. ? ?We recommend signing up for the patient portal called "MyChart".  Sign up information is provided on this After Visit Summary.  MyChart is used to connect with patients for Virtual Visits (Telemedicine).  Patients are able to view lab/test results, encounter notes, upcoming appointments, etc.  Non-urgent messages can be sent to your provider as well.   ?To learn more about what you can do with MyChart, go to [email or web address].   ? ?Your next appointment:   ?6 month(s) ? ?The  format for your next appointment:   ?In Person ? ?Provider:   ?Berniece Salines, DO  ? ? ?Other Instructions ? ? ?Important Information About Sugar ? ? ? ? ?  ?

## 2022-02-19 ENCOUNTER — Other Ambulatory Visit (HOSPITAL_BASED_OUTPATIENT_CLINIC_OR_DEPARTMENT_OTHER): Payer: Self-pay

## 2022-02-25 ENCOUNTER — Other Ambulatory Visit (HOSPITAL_BASED_OUTPATIENT_CLINIC_OR_DEPARTMENT_OTHER): Payer: Self-pay

## 2022-02-25 MED ORDER — PREDNISONE 10 MG PO TABS
ORAL_TABLET | ORAL | 0 refills | Status: DC
  Filled 2022-02-25: qty 21, 6d supply, fill #0

## 2022-02-25 MED ORDER — TRAMADOL HCL 50 MG PO TABS
ORAL_TABLET | ORAL | 1 refills | Status: DC
  Filled 2022-02-25 – 2022-03-08 (×3): qty 60, 30d supply, fill #0
  Filled 2022-04-09: qty 60, 30d supply, fill #1

## 2022-02-28 ENCOUNTER — Other Ambulatory Visit (HOSPITAL_BASED_OUTPATIENT_CLINIC_OR_DEPARTMENT_OTHER): Payer: Self-pay

## 2022-03-07 ENCOUNTER — Other Ambulatory Visit (HOSPITAL_BASED_OUTPATIENT_CLINIC_OR_DEPARTMENT_OTHER): Payer: Self-pay

## 2022-03-08 ENCOUNTER — Other Ambulatory Visit (HOSPITAL_BASED_OUTPATIENT_CLINIC_OR_DEPARTMENT_OTHER): Payer: Self-pay

## 2022-04-09 ENCOUNTER — Other Ambulatory Visit (HOSPITAL_BASED_OUTPATIENT_CLINIC_OR_DEPARTMENT_OTHER): Payer: Self-pay

## 2022-04-22 ENCOUNTER — Other Ambulatory Visit (HOSPITAL_BASED_OUTPATIENT_CLINIC_OR_DEPARTMENT_OTHER): Payer: Self-pay

## 2022-04-22 MED ORDER — TRAMADOL HCL 50 MG PO TABS
ORAL_TABLET | ORAL | 1 refills | Status: DC
  Filled 2022-04-22 – 2022-05-09 (×2): qty 90, 30d supply, fill #0
  Filled 2022-08-02: qty 90, 30d supply, fill #1

## 2022-04-25 ENCOUNTER — Other Ambulatory Visit (HOSPITAL_BASED_OUTPATIENT_CLINIC_OR_DEPARTMENT_OTHER): Payer: Self-pay

## 2022-04-25 MED ORDER — DIAZEPAM 5 MG PO TABS
ORAL_TABLET | ORAL | 0 refills | Status: DC
  Filled 2022-04-25: qty 2, 2d supply, fill #0

## 2022-05-09 ENCOUNTER — Other Ambulatory Visit (HOSPITAL_BASED_OUTPATIENT_CLINIC_OR_DEPARTMENT_OTHER): Payer: Self-pay

## 2022-06-25 DIAGNOSIS — R278 Other lack of coordination: Secondary | ICD-10-CM | POA: Diagnosis not present

## 2022-06-25 DIAGNOSIS — M47816 Spondylosis without myelopathy or radiculopathy, lumbar region: Secondary | ICD-10-CM | POA: Diagnosis not present

## 2022-06-25 DIAGNOSIS — M5459 Other low back pain: Secondary | ICD-10-CM | POA: Diagnosis not present

## 2022-06-25 DIAGNOSIS — R2689 Other abnormalities of gait and mobility: Secondary | ICD-10-CM | POA: Diagnosis not present

## 2022-06-27 DIAGNOSIS — R2689 Other abnormalities of gait and mobility: Secondary | ICD-10-CM | POA: Diagnosis not present

## 2022-06-27 DIAGNOSIS — R278 Other lack of coordination: Secondary | ICD-10-CM | POA: Diagnosis not present

## 2022-06-27 DIAGNOSIS — M47816 Spondylosis without myelopathy or radiculopathy, lumbar region: Secondary | ICD-10-CM | POA: Diagnosis not present

## 2022-06-27 DIAGNOSIS — M5459 Other low back pain: Secondary | ICD-10-CM | POA: Diagnosis not present

## 2022-07-01 DIAGNOSIS — H5213 Myopia, bilateral: Secondary | ICD-10-CM | POA: Diagnosis not present

## 2022-07-01 DIAGNOSIS — H401131 Primary open-angle glaucoma, bilateral, mild stage: Secondary | ICD-10-CM | POA: Diagnosis not present

## 2022-07-02 DIAGNOSIS — R2689 Other abnormalities of gait and mobility: Secondary | ICD-10-CM | POA: Diagnosis not present

## 2022-07-02 DIAGNOSIS — R278 Other lack of coordination: Secondary | ICD-10-CM | POA: Diagnosis not present

## 2022-07-02 DIAGNOSIS — M47816 Spondylosis without myelopathy or radiculopathy, lumbar region: Secondary | ICD-10-CM | POA: Diagnosis not present

## 2022-07-02 DIAGNOSIS — M5459 Other low back pain: Secondary | ICD-10-CM | POA: Diagnosis not present

## 2022-07-08 DIAGNOSIS — M5459 Other low back pain: Secondary | ICD-10-CM | POA: Diagnosis not present

## 2022-07-08 DIAGNOSIS — R2689 Other abnormalities of gait and mobility: Secondary | ICD-10-CM | POA: Diagnosis not present

## 2022-07-08 DIAGNOSIS — R278 Other lack of coordination: Secondary | ICD-10-CM | POA: Diagnosis not present

## 2022-07-08 DIAGNOSIS — M47816 Spondylosis without myelopathy or radiculopathy, lumbar region: Secondary | ICD-10-CM | POA: Diagnosis not present

## 2022-07-11 DIAGNOSIS — R278 Other lack of coordination: Secondary | ICD-10-CM | POA: Diagnosis not present

## 2022-07-11 DIAGNOSIS — M5459 Other low back pain: Secondary | ICD-10-CM | POA: Diagnosis not present

## 2022-07-11 DIAGNOSIS — M47816 Spondylosis without myelopathy or radiculopathy, lumbar region: Secondary | ICD-10-CM | POA: Diagnosis not present

## 2022-07-11 DIAGNOSIS — R2689 Other abnormalities of gait and mobility: Secondary | ICD-10-CM | POA: Diagnosis not present

## 2022-07-23 ENCOUNTER — Other Ambulatory Visit (HOSPITAL_BASED_OUTPATIENT_CLINIC_OR_DEPARTMENT_OTHER): Payer: Self-pay

## 2022-07-23 MED ORDER — FLUAD QUADRIVALENT 0.5 ML IM PRSY
PREFILLED_SYRINGE | INTRAMUSCULAR | 0 refills | Status: DC
Start: 2022-07-23 — End: 2023-09-12
  Filled 2022-07-23: qty 0.5, 1d supply, fill #0

## 2022-07-26 DIAGNOSIS — M47816 Spondylosis without myelopathy or radiculopathy, lumbar region: Secondary | ICD-10-CM | POA: Diagnosis not present

## 2022-07-26 DIAGNOSIS — M5459 Other low back pain: Secondary | ICD-10-CM | POA: Diagnosis not present

## 2022-07-26 DIAGNOSIS — R278 Other lack of coordination: Secondary | ICD-10-CM | POA: Diagnosis not present

## 2022-07-26 DIAGNOSIS — R2689 Other abnormalities of gait and mobility: Secondary | ICD-10-CM | POA: Diagnosis not present

## 2022-07-29 ENCOUNTER — Ambulatory Visit (HOSPITAL_COMMUNITY): Payer: Medicare PPO | Attending: Cardiology

## 2022-07-29 DIAGNOSIS — I340 Nonrheumatic mitral (valve) insufficiency: Secondary | ICD-10-CM | POA: Insufficient documentation

## 2022-07-29 LAB — ECHOCARDIOGRAM COMPLETE
AR max vel: 1.02 cm2
AV Area VTI: 1.01 cm2
AV Area mean vel: 0.94 cm2
AV Mean grad: 15 mmHg
AV Peak grad: 23.5 mmHg
Ao pk vel: 2.43 m/s
Area-P 1/2: 3.31 cm2
P 1/2 time: 566 msec
S' Lateral: 1.8 cm

## 2022-08-01 DIAGNOSIS — M5459 Other low back pain: Secondary | ICD-10-CM | POA: Diagnosis not present

## 2022-08-01 DIAGNOSIS — M47816 Spondylosis without myelopathy or radiculopathy, lumbar region: Secondary | ICD-10-CM | POA: Diagnosis not present

## 2022-08-01 DIAGNOSIS — R278 Other lack of coordination: Secondary | ICD-10-CM | POA: Diagnosis not present

## 2022-08-01 DIAGNOSIS — R2689 Other abnormalities of gait and mobility: Secondary | ICD-10-CM | POA: Diagnosis not present

## 2022-08-02 ENCOUNTER — Other Ambulatory Visit (HOSPITAL_BASED_OUTPATIENT_CLINIC_OR_DEPARTMENT_OTHER): Payer: Self-pay

## 2022-08-06 ENCOUNTER — Encounter: Payer: Self-pay | Admitting: Cardiology

## 2022-08-06 ENCOUNTER — Ambulatory Visit: Payer: Medicare PPO | Attending: Cardiology | Admitting: Cardiology

## 2022-08-06 VITALS — BP 122/80 | HR 69 | Ht 62.0 in | Wt 140.2 lb

## 2022-08-06 DIAGNOSIS — I493 Ventricular premature depolarization: Secondary | ICD-10-CM

## 2022-08-06 DIAGNOSIS — I471 Supraventricular tachycardia, unspecified: Secondary | ICD-10-CM

## 2022-08-06 DIAGNOSIS — I491 Atrial premature depolarization: Secondary | ICD-10-CM

## 2022-08-06 DIAGNOSIS — I351 Nonrheumatic aortic (valve) insufficiency: Secondary | ICD-10-CM

## 2022-08-06 DIAGNOSIS — I350 Nonrheumatic aortic (valve) stenosis: Secondary | ICD-10-CM

## 2022-08-06 NOTE — Patient Instructions (Signed)
Medication Instructions:  Your physician recommends that you continue on your current medications as directed. Please refer to the Current Medication list given to you today.  *If you need a refill on your cardiac medications before your next appointment, please call your pharmacy*   Lab Work: NONE If you have labs (blood work) drawn today and your tests are completely normal, you will receive your results only by: MyChart Message (if you have MyChart) OR A paper copy in the mail If you have any lab test that is abnormal or we need to change your treatment, we will call you to review the results.   Testing/Procedures: NONE   Follow-Up: At [facility], you and your health needs are our priority.  As part of our continuing mission to provide you with exceptional heart care, we have created designated Provider Care Teams.  These Care Teams include your primary Cardiologist (physician) and Advanced Practice Providers (APPs -  Physician Assistants and Nurse Practitioners) who all work together to provide you with the care you need, when you need it.  We recommend signing up for the patient portal called "MyChart".  Sign up information is provided on this After Visit Summary.  MyChart is used to connect with patients for Virtual Visits (Telemedicine).  Patients are able to view lab/test results, encounter notes, upcoming appointments, etc.  Non-urgent messages can be sent to your provider as well.   To learn more about what you can do with MyChart, go to [email or web address].    Your next appointment:   1 year(s)  The format for your next appointment:   In Person  Provider:   Kardie Tobb, DO   

## 2022-08-06 NOTE — Progress Notes (Unsigned)
Cardiology Office Note:    Date:  08/08/2022   ID:  Monique Wright, DOB 03/11/1936, MRN [id number]  PCP:  Burnard Bunting, MD  Cardiologist:  Berniece Salines, DO  Electrophysiologist:  None   Referring MD: Burnard Bunting, MD   " I am doing well"  History of Present Illness:    Monique Wright is a 86 y.o. female with a hx of moderate aortic regurgitation, aortic valve stenosis, mitral regurgitation, paroxysmal SVT, PACs here today for follow-up visit.  I saw the patient in April 2023 at that time she was doing well from a cardiovascular standpoint.  She had had good relief from her blocker that was started.  She offers no complaints today.  She is doing well.  She denies any chest pain or shortness of breath.  Past Medical History:  Diagnosis Date   Anemia    Arthritis    spinal arthritis   Coronary atherosclerosis of native coronary artery    pt denies having any heart problems and never seen the person who entered this in.   Fatigue    Hypertension    Hypothyroidism    Left ventricular systolic dysfunction    Plantar fasciitis    Scoliosis    Shortness of breath    on exertion    Past Surgical History:  Procedure Laterality Date   ABDOMINAL HYSTERECTOMY     tah and bso   APPENDECTOMY     ENTEROSCOPY N/A 12/21/2013   Procedure: ENTEROSCOPY;  Surgeon: Irene Shipper, MD;  Location: [facility];  Service: Endoscopy;  Laterality: N/A;   TONSILECTOMY, ADENOIDECTOMY, BILATERAL MYRINGOTOMY AND TUBES     torn cartlidge repair Right yrs ago    Current Medications: Current Meds  Medication Sig   Cholecalciferol (VITAMIN D3) 50 MCG (2000 UT) CAPS Take 50 mcg by mouth daily.   COVID-19 mRNA bivalent vaccine, Pfizer, (PFIZER COVID-19 VAC BIVALENT) injection Inject into the muscle.   influenza vaccine adjuvanted (FLUAD QUADRIVALENT) 0.5 ML injection Inject into the muscle.   influenza vaccine adjuvanted (FLUAD QUADRIVALENT) 0.5 ML injection Inject into the muscle.    levothyroxine (SYNTHROID, LEVOTHROID) 50 MCG tablet Take 1 tablet by mouth daily before breakfast.    LUMIGAN 0.01 % SOLN 1 drop at bedtime.   metoprolol succinate (TOPROL XL) 25 MG 24 hr tablet Take 0.5 tablets (12.5 mg total) by mouth at bedtime.   omeprazole (PRILOSEC) 40 MG capsule TAKE 1 CAPSULE BY MOUTH TWICE A DAY   traMADol (ULTRAM) 50 MG tablet take 1 tablet by mouth 3 times daily   traZODone (DESYREL) 100 MG tablet Take 100 mg by mouth at bedtime.    valsartan-hydrochlorothiazide (DIOVAN-HCT) 320-25 MG per tablet Take 1 tablet by mouth every morning.      Allergies:   Penicillins, Pneumococcal vac polyvalent, Pneumococcal vaccines, and Prevnar 13 [pneumococcal 13-val conj vacc]   Social History   Socioeconomic History   Marital status: Married    Spouse name: Not on file   Number of children: Not on file   Years of education: Not on file   Highest education level: Not on file  Occupational History   Occupation: retired Programmer, multimedia: [employer]  Tobacco Use   Smoking status: Never   Smokeless tobacco: Never  Vaping Use   Vaping Use: Never used  Substance and Sexual Activity   Alcohol use: Yes    Comment: occasional   Drug use: No   Sexual activity: Never  Partners: Male  Other Topics Concern   Not on file  Social History Narrative   Not on file   Social Determinants of Health   Financial Resource Strain: Not on file  Food Insecurity: Not on file  Transportation Needs: Not on file  Physical Activity: Not on file  Stress: Not on file  Social Connections: Not on file     Family History: The patient's family history includes Heart attack in her mother; Lung cancer in her father. There is no history of Stomach cancer, Colon cancer, or Breast cancer.  ROS:   Review of Systems  Constitution: Negative for decreased appetite, fever and weight gain.  HENT: Negative for congestion, ear discharge, hoarse voice and sore throat.   Eyes: Negative for  discharge, redness, vision loss in right eye and visual halos.  Cardiovascular: Negative for chest pain, dyspnea on exertion, leg swelling, orthopnea and palpitations.  Respiratory: Negative for cough, hemoptysis, shortness of breath and snoring.   Endocrine: Negative for heat intolerance and polyphagia.  Hematologic/Lymphatic: Negative for bleeding problem. Does not bruise/bleed easily.  Skin: Negative for flushing, nail changes, rash and suspicious lesions.  Musculoskeletal: Negative for arthritis, joint pain, muscle cramps, myalgias, neck pain and stiffness.  Gastrointestinal: Negative for abdominal pain, bowel incontinence, diarrhea and excessive appetite.  Genitourinary: Negative for decreased libido, genital sores and incomplete emptying.  Neurological: Negative for brief paralysis, focal weakness, headaches and loss of balance.  Psychiatric/Behavioral: Negative for altered mental status, depression and suicidal ideas.  Allergic/Immunologic: Negative for HIV exposure and persistent infections.    EKGs/Labs/Other Studies Reviewed:    The following studies were reviewed today:   EKG: None today  TTE 11/09/2021 IMPRESSIONS     1. Left ventricular ejection fraction, by estimation, is 60 to 65%. The  left ventricle has normal function. The left ventricle has no regional  wall motion abnormalities. Left ventricular diastolic parameters are  consistent with Grade I diastolic  dysfunction (impaired relaxation).   2. Right ventricular systolic function is normal. The right ventricular  size is normal.   3. The pericardial effusion is posterior to the left ventricle.   4. The mitral valve is abnormal. Mild mitral valve regurgitation. No  evidence of mitral stenosis.   5. The aortic valve is calcified. There is moderate calcification of the  aortic valve. Aortic valve regurgitation is moderate. Mild to moderate  aortic valve stenosis.   6. The inferior vena cava is normal in size with  greater than 50%  respiratory variability, suggesting right atrial pressure of 3 mmHg.   FINDINGS   Left Ventricle: Left ventricular ejection fraction, by estimation, is 60  to 65%. The left ventricle has normal function. The left ventricle has no  regional wall motion abnormalities. The left ventricular internal cavity  size was normal in size. There is   no left ventricular hypertrophy. Left ventricular diastolic parameters  are consistent with Grade I diastolic dysfunction (impaired relaxation).   Right Ventricle: The right ventricular size is normal. No increase in  right ventricular wall thickness. Right ventricular systolic function is  normal.   Left Atrium: Left atrial size was normal in size.   Right Atrium: Right atrial size was normal in size.   Pericardium: Trivial pericardial effusion is present. The pericardial effusion is posterior to the left ventricle.   Mitral Valve: The mitral valve is abnormal. There is mild thickening of the mitral valve leaflet(s). There is mild calcification of the mitral valve leaflet(s). Mild mitral valve regurgitation.  No evidence of mitral valve stenosis.   Tricuspid Valve: The tricuspid valve is normal in structure. Tricuspid valve regurgitation is mild . No evidence of tricuspid stenosis.   Aortic Valve: The aortic valve is calcified. There is moderate calcification of the aortic valve. Aortic valve regurgitation is moderate. Aortic regurgitation PHT measures 927 msec. Mild to moderate aortic  stenosis is present. Aortic valve mean gradient  measures 10.3 mmHg. Aortic valve peak gradient measures 18.7 mmHg. Aortic  valve area, by VTI measures 1.10 cm.   Pulmonic Valve: The pulmonic valve was normal in structure. Pulmonic valve  regurgitation is trivial. No evidence of pulmonic stenosis.   Aorta: The aortic root is normal in size and structure.   Venous: The inferior vena cava is normal in size with greater than 50%  respiratory  variability, suggesting right atrial pressure of 3 mmHg.   IAS/Shunts: No atrial level shunt detected by color flow Doppler.      Zio monitor  Patch Wear Time:  10 days and 9 hours (2023-01-05T10:25:46-0500 to 2023-01-15T20:15:44-0500)   Indication: Premature ventricular complexes.   Patient had a min HR of 55 bpm, max HR of 190 bpm, and avg HR of 78 bpm. Predominant underlying rhythm was Sinus Rhythm.    442 Supraventricular Tachycardia runs occurred, the run with the fastest interval lasting 4 beats with a max rate of 190 bpm, the  longest lasting 33.0 secs with an avg rate of 144 bpm. Supraventricular Tachycardia was detected within +/- 45 seconds of symptomatic patient event(s). Isolated SVEs were occasional (1.3%, 14444), SVE Couplets were rare (<1.0%, 1342), and SVE Triplets  were rare (<1.0%, 405). Isolated VEs were rare (<1.0%), and no VE Couplets or VE Triplets were present.   Symptoms associated with supraventricular tachycardia and premature ventricular complex.  Conclusion: This study is remarkable for the following:                                  1.  Symptomatic paroxysmal supraventricular tachycardia.                                   2.  Symptomatic occasional premature atrial complexes.    Recent Labs: No results found for requested labs within last 365 days.  Recent Lipid Panel No results found for: "CHOL", "TRIG", "HDL", "CHOLHDL", "VLDL", "LDLCALC", "LDLDIRECT"  Physical Exam:    VS:  BP 122/80   Pulse 69   Ht '5\' 2"'$  (1.575 m)   Wt 140 lb 3.2 oz (63.6 kg)   SpO2 99%   BMI 25.64 kg/m     Wt Readings from Last 3 Encounters:  08/06/22 140 lb 3.2 oz (63.6 kg)  02/06/22 140 lb 3.2 oz (63.6 kg)  11/15/21 141 lb 6.4 oz (64.1 kg)     GEN: Well nourished, well developed in no acute distress HEENT: Normal NECK: No JVD; No carotid bruits LYMPHATICS: No lymphadenopathy CARDIAC: S1S2 noted,RRR, no murmurs, rubs, gallops RESPIRATORY:  Clear to auscultation  without rales, wheezing or rhonchi  ABDOMEN: Soft, non-tender, non-distended, +bowel sounds, no guarding. EXTREMITIES: No edema, No cyanosis, no clubbing MUSCULOSKELETAL:  No deformity  SKIN: Warm and dry NEUROLOGIC:  Alert and oriented x 3, non-focal PSYCHIATRIC:  Normal affect, good insight  ASSESSMENT:    1. Asymptomatic PVCs   2. Moderate aortic regurgitation   3. PSVT (paroxysmal supraventricular  tachycardia)   4. Nonrheumatic aortic (valve) stenosis   5. PAC (premature atrial contraction)    PLAN:     Clinically she appears to be doing well from a cardiovascular standpoint.  No medication changes today. We will continue her beta-blocker. She is asymptomatic we will continue to monitor the patient closely.  Repeat echo in 1 year.  The patient is in agreement with the above plan. The patient left the office in stable condition.  The patient will follow up in   Medication Adjustments/Labs and Tests Ordered: Current medicines are reviewed at length with the patient today.  Concerns regarding medicines are outlined above.  No orders of the defined types were placed in this encounter.  No orders of the defined types were placed in this encounter.   Patient Instructions  Medication Instructions:  Your physician recommends that you continue on your current medications as directed. Please refer to the Current Medication list given to you today.  *If you need a refill on your cardiac medications before your next appointment, please call your pharmacy*   Lab Work: NONE If you have labs (blood work) drawn today and your tests are completely normal, you will receive your results only by: [facility] (if you have MyChart) OR A paper copy in the mail If you have any lab test that is abnormal or we need to change your treatment, we will call you to review the results.   Testing/Procedures: NONE   Follow-Up: At [facility] ([place]), you and your health needs are our  priority.  As part of our continuing mission to provide you with exceptional heart care, we have created designated Provider Care Teams.  These Care Teams include your primary Cardiologist (physician) and Advanced Practice Providers (APPs -  Physician Assistants and Nurse Practitioners) who all work together to provide you with the care you need, when you need it.  We recommend signing up for the patient portal called "MyChart".  Sign up information is provided on this After Visit Summary.  MyChart is used to connect with patients for Virtual Visits (Telemedicine).  Patients are able to view lab/test results, encounter notes, upcoming appointments, etc.  Non-urgent messages can be sent to your provider as well.   To learn more about what you can do with MyChart, go to [email or web address].    Your next appointment:   1 year(s)  The format for your next appointment:   In Person  Provider:   Berniece Salines, DO     Adopting a Healthy Lifestyle.  Know what a healthy weight is for you (roughly BMI <25) and aim to maintain this   Aim for 7+ servings of fruits and vegetables daily   65-80+ fluid ounces of water or unsweet tea for healthy kidneys   Limit to max 1 drink of alcohol per day; avoid smoking/tobacco   Limit animal fats in diet for cholesterol and heart health - choose grass fed whenever available   Avoid highly processed foods, and foods high in saturated/trans fats   Aim for low stress - take time to unwind and care for your mental health   Aim for 150 min of moderate intensity exercise weekly for heart health, and weights twice weekly for bone health   Aim for 7-9 hours of sleep daily   When it comes to diets, agreement about the perfect plan isnt easy to find, even among the experts. Experts at the [employer] developed an idea known as the [employer]  Plate. Just imagine a plate divided into logical, healthy portions.   The emphasis is on diet  quality:   Load up on vegetables and fruits - one-half of your plate: Aim for color and variety, and remember that potatoes dont count.   Go for whole grains - one-quarter of your plate: Whole wheat, barley, wheat berries, quinoa, oats, brown rice, and foods made with them. If you want pasta, go with whole wheat pasta.   Protein power - one-quarter of your plate: Fish, chicken, beans, and nuts are all healthy, versatile protein sources. Limit red meat.   The diet, however, does go beyond the plate, offering a few other suggestions.   Use healthy plant oils, such as olive, canola, soy, corn, sunflower and peanut. Check the labels, and avoid partially hydrogenated oil, which have unhealthy trans fats.   If youre thirsty, drink water. Coffee and tea are good in moderation, but skip sugary drinks and limit milk and dairy products to one or two daily servings.   The type of carbohydrate in the diet is more important than the amount. Some sources of carbohydrates, such as vegetables, fruits, whole grains, and beans-are healthier than others.   Finally, stay active  Signed, Berniece Salines, DO  08/08/2022 10:51 PM    [facility]

## 2022-08-20 ENCOUNTER — Other Ambulatory Visit (HOSPITAL_BASED_OUTPATIENT_CLINIC_OR_DEPARTMENT_OTHER): Payer: Self-pay

## 2022-08-20 DIAGNOSIS — R059 Cough, unspecified: Secondary | ICD-10-CM | POA: Diagnosis not present

## 2022-08-20 DIAGNOSIS — Z1152 Encounter for screening for COVID-19: Secondary | ICD-10-CM | POA: Diagnosis not present

## 2022-08-20 DIAGNOSIS — J0100 Acute maxillary sinusitis, unspecified: Secondary | ICD-10-CM | POA: Diagnosis not present

## 2022-08-20 DIAGNOSIS — R5383 Other fatigue: Secondary | ICD-10-CM | POA: Diagnosis not present

## 2022-08-20 MED ORDER — AZITHROMYCIN 250 MG PO TABS
ORAL_TABLET | ORAL | 0 refills | Status: DC
  Filled 2022-08-20: qty 6, 5d supply, fill #0

## 2022-09-05 ENCOUNTER — Other Ambulatory Visit (HOSPITAL_BASED_OUTPATIENT_CLINIC_OR_DEPARTMENT_OTHER): Payer: Self-pay

## 2022-09-05 DIAGNOSIS — M7062 Trochanteric bursitis, left hip: Secondary | ICD-10-CM | POA: Diagnosis not present

## 2022-09-05 DIAGNOSIS — M47816 Spondylosis without myelopathy or radiculopathy, lumbar region: Secondary | ICD-10-CM | POA: Diagnosis not present

## 2022-09-05 MED ORDER — TRAMADOL HCL 50 MG PO TABS
50.0000 mg | ORAL_TABLET | Freq: Three times a day (TID) | ORAL | 1 refills | Status: DC
  Filled 2022-09-05: qty 90, 30d supply, fill #0
  Filled 2022-11-25: qty 90, 30d supply, fill #1

## 2022-09-10 DIAGNOSIS — L821 Other seborrheic keratosis: Secondary | ICD-10-CM | POA: Diagnosis not present

## 2022-09-10 DIAGNOSIS — L570 Actinic keratosis: Secondary | ICD-10-CM | POA: Diagnosis not present

## 2022-10-03 DIAGNOSIS — E039 Hypothyroidism, unspecified: Secondary | ICD-10-CM | POA: Diagnosis not present

## 2022-10-03 DIAGNOSIS — R5383 Other fatigue: Secondary | ICD-10-CM | POA: Diagnosis not present

## 2022-10-03 DIAGNOSIS — E785 Hyperlipidemia, unspecified: Secondary | ICD-10-CM | POA: Diagnosis not present

## 2022-10-09 DIAGNOSIS — M419 Scoliosis, unspecified: Secondary | ICD-10-CM | POA: Diagnosis not present

## 2022-10-09 DIAGNOSIS — I10 Essential (primary) hypertension: Secondary | ICD-10-CM | POA: Diagnosis not present

## 2022-10-09 DIAGNOSIS — I350 Nonrheumatic aortic (valve) stenosis: Secondary | ICD-10-CM | POA: Diagnosis not present

## 2022-10-22 ENCOUNTER — Other Ambulatory Visit (HOSPITAL_BASED_OUTPATIENT_CLINIC_OR_DEPARTMENT_OTHER): Payer: Self-pay

## 2022-10-22 MED ORDER — COMIRNATY 30 MCG/0.3ML IM SUSY
0.3000 mL | PREFILLED_SYRINGE | INTRAMUSCULAR | 0 refills | Status: DC
  Filled 2022-10-22: qty 0.3, 1d supply, fill #0

## 2022-10-25 ENCOUNTER — Other Ambulatory Visit (HOSPITAL_BASED_OUTPATIENT_CLINIC_OR_DEPARTMENT_OTHER): Payer: Self-pay

## 2022-11-05 DIAGNOSIS — H401131 Primary open-angle glaucoma, bilateral, mild stage: Secondary | ICD-10-CM | POA: Diagnosis not present

## 2022-11-05 DIAGNOSIS — H04123 Dry eye syndrome of bilateral lacrimal glands: Secondary | ICD-10-CM | POA: Diagnosis not present

## 2022-11-07 ENCOUNTER — Other Ambulatory Visit (HOSPITAL_BASED_OUTPATIENT_CLINIC_OR_DEPARTMENT_OTHER): Payer: Self-pay

## 2022-11-13 ENCOUNTER — Other Ambulatory Visit: Payer: Self-pay | Admitting: Internal Medicine

## 2022-11-13 DIAGNOSIS — Z1231 Encounter for screening mammogram for malignant neoplasm of breast: Secondary | ICD-10-CM

## 2022-11-26 ENCOUNTER — Other Ambulatory Visit: Payer: Self-pay

## 2022-12-05 DIAGNOSIS — M7062 Trochanteric bursitis, left hip: Secondary | ICD-10-CM | POA: Diagnosis not present

## 2022-12-05 DIAGNOSIS — M419 Scoliosis, unspecified: Secondary | ICD-10-CM | POA: Diagnosis not present

## 2022-12-05 DIAGNOSIS — M47816 Spondylosis without myelopathy or radiculopathy, lumbar region: Secondary | ICD-10-CM | POA: Diagnosis not present

## 2022-12-11 ENCOUNTER — Telehealth: Payer: Self-pay | Admitting: Cardiology

## 2022-12-11 NOTE — Telephone Encounter (Signed)
Called patient, advised that she was SOB while walking around today in [employer]. She states she was afraid she wouldn't make it back to the car- she denies any other symptoms, no swelling, chest pains.     Patient had scheduled appointment on 01/24/2023- however with these new symptoms she states she wanted to come in sooner. 12/12/2022.  Patient verbalized understanding. She will come tomorrow- I will route to PA seeing patient.  Thanks!

## 2022-12-11 NOTE — Telephone Encounter (Signed)
Pt c/o Shortness Of Breath: STAT if SOB developed within the last 24 hours or pt is noticeably SOB on the phone  1. Are you currently SOB (can you hear that pt is SOB on the phone)? No; became SOB around 12:00 pm while in [employer]  2. How long have you been experiencing SOB? A couple weeks  3. Are you SOB when sitting or when up moving around? Moving around  4. Are you currently experiencing any other symptoms? BP is 188/90; 88 (usually in 60's)

## 2022-12-12 ENCOUNTER — Ambulatory Visit: Payer: Medicare PPO | Attending: Physician Assistant | Admitting: Physician Assistant

## 2022-12-12 ENCOUNTER — Encounter: Payer: Self-pay | Admitting: Physician Assistant

## 2022-12-12 VITALS — BP 146/88 | HR 78 | Ht 63.0 in | Wt 137.8 lb

## 2022-12-12 DIAGNOSIS — R0609 Other forms of dyspnea: Secondary | ICD-10-CM | POA: Diagnosis not present

## 2022-12-12 DIAGNOSIS — I10 Essential (primary) hypertension: Secondary | ICD-10-CM | POA: Diagnosis not present

## 2022-12-12 DIAGNOSIS — I350 Nonrheumatic aortic (valve) stenosis: Secondary | ICD-10-CM

## 2022-12-12 MED ORDER — METOPROLOL SUCCINATE ER 25 MG PO TB24: 25.0000 mg | ORAL_TABLET | Freq: Every evening | ORAL | 3 refills | Status: DC

## 2022-12-12 NOTE — Progress Notes (Signed)
Cardiology Office Note:    Date:  12/14/2022   ID:  Monique Wright, DOB May 15, 1936, MRN [id number]  PCP:  Burnard Bunting, MD   [facility] Providers Cardiologist:  Berniece Salines, DO     Referring MD: Burnard Bunting, MD   Chief Complaint  Patient presents with   Follow-up    Seen for Dr. Harriet Masson    History of Present Illness:    Monique Wright is a 87 y.o. female with a hx of moderate aortic regurgitation/aortic stenosis, hypothyroidism, hypertension, paroxysmal SVT and PACs.  Patient was seen by Dr. Harriet Masson in December 2022 for significant shortness of breath as well as frequent PVCs.  Echocardiogram obtained in January 2023 demonstrated EF 60 to 65%, no regional wall motion abnormality, pericardial effusion posterior to the left ventricle, mild MR, moderate AI aortic regurgitation and mild to moderate aortic stenosis.  Heart monitor was placed in January 2023 that demonstrated minimal heart rate 55 bpm, maximal heart rate 190 bpm, average heart rate 78 bpm, 442 episodes of SVT with the longest episode lasting 33 seconds, SVT was detected within 45 seconds of symptomatic events.  Most recent repeat echocardiogram obtained on 07/29/2022 showed EF of 60 to 65%, mild LVH, mild MR, mild aortic regurgitation and mild to moderate aortic stenosis.  Patient was last seen by Dr. Harriet Masson on 10/17//2023 at which time she was doing well.  Patient called cardiology service yesterday complaining of shortness of breath while walking around in [employer] yesterday.  Blood pressure was high in the 180s.  Patient presents today for follow-up.  Blood pressure is high on arrival.  Even on manual recheck, blood pressure was 146/88.  She denies any recent chest pain.  She describes shortness of breath with exertion and the palpitation during exertion.  She feels her heart was fluttering at the time.  She has been progressively getting more short of breath with physical activity.  She does have scoliosis,  however her scoliosis doctor did not think it was contributing to her shortness of breath.  I will increase her metoprolol succinate to 25 mg every night.  I will also obtain blood work to rule out secondary causes including CBC, basic metabolic panel and a TSH.  I discussed her case with DOD Dr. Harriet Masson, given lack of chest pain, we will hold off on doing a PET stress test rule out anginal equivalent, we will start with the echocardiogram for now to make sure her valve has not worsened.  She will follow-up in 2 to 3 months with Dr. Harriet Masson or APP.   Past Medical History:  Diagnosis Date   Anemia    Arthritis    spinal arthritis   Coronary atherosclerosis of native coronary artery    pt denies having any heart problems and never seen the person who entered this in.   Fatigue    Hypertension    Hypothyroidism    Left ventricular systolic dysfunction    Plantar fasciitis    Scoliosis    Shortness of breath    on exertion    Past Surgical History:  Procedure Laterality Date   ABDOMINAL HYSTERECTOMY     tah and bso   APPENDECTOMY     ENTEROSCOPY N/A 12/21/2013   Procedure: ENTEROSCOPY;  Surgeon: Irene Shipper, MD;  Location: [facility];  Service: Endoscopy;  Laterality: N/A;   TONSILECTOMY, ADENOIDECTOMY, BILATERAL MYRINGOTOMY AND TUBES     torn cartlidge repair Right yrs ago    Current Medications:  Current Meds  Medication Sig   Cholecalciferol (VITAMIN D3) 50 MCG (2000 UT) CAPS Take 50 mcg by mouth daily.   COVID-19 mRNA bivalent vaccine, Pfizer, (PFIZER COVID-19 VAC BIVALENT) injection Inject into the muscle.   COVID-19 mRNA vaccine 2023-2024 (COMIRNATY) syringe Inject 0.3 mLs into the muscle.   influenza vaccine adjuvanted (FLUAD QUADRIVALENT) 0.5 ML injection Inject into the muscle.   influenza vaccine adjuvanted (FLUAD QUADRIVALENT) 0.5 ML injection Inject into the muscle.   levothyroxine (SYNTHROID, LEVOTHROID) 50 MCG tablet Take 1 tablet by mouth daily before breakfast.     LUMIGAN 0.01 % SOLN 1 drop at bedtime.   omeprazole (PRILOSEC) 40 MG capsule TAKE 1 CAPSULE BY MOUTH TWICE A DAY   rosuvastatin (CRESTOR) 10 MG tablet Take 10 mg by mouth daily.   traMADol (ULTRAM) 50 MG tablet Take 1 tablet (50 mg total) by mouth 3 (three) times daily.   traZODone (DESYREL) 100 MG tablet Take 100 mg by mouth at bedtime.    valsartan-hydrochlorothiazide (DIOVAN-HCT) 320-25 MG per tablet Take 1 tablet by mouth every morning.    [DISCONTINUED] metoprolol succinate (TOPROL XL) 25 MG 24 hr tablet Take 0.5 tablets (12.5 mg total) by mouth at bedtime.     Allergies:   Penicillins, Pneumococcal vac polyvalent, Pneumococcal vaccines, and Prevnar 13 [pneumococcal 13-val conj vacc]   Social History   Socioeconomic History   Marital status: Married    Spouse name: Not on file   Number of children: Not on file   Years of education: Not on file   Highest education level: Not on file  Occupational History   Occupation: retired Programmer, multimedia: [employer]  Tobacco Use   Smoking status: Never   Smokeless tobacco: Never  Vaping Use   Vaping Use: Never used  Substance and Sexual Activity   Alcohol use: Yes    Comment: occasional   Drug use: No   Sexual activity: Never    Partners: Male  Other Topics Concern   Not on file  Social History Narrative   Not on file   Social Determinants of Health   Financial Resource Strain: Not on file  Food Insecurity: Not on file  Transportation Needs: Not on file  Physical Activity: Not on file  Stress: Not on file  Social Connections: Not on file     Family History: The patient's family history includes Heart attack in her mother; Lung cancer in her father. There is no history of Stomach cancer, Colon cancer, or Breast cancer.  ROS:   Please see the history of present illness.     All other systems reviewed and are negative.  EKGs/Labs/Other Studies Reviewed:    The following studies were reviewed today:  Echo  07/29/2022  1. Left ventricular ejection fraction, by estimation, is 60 to 65%. The  left ventricle has normal function. The left ventricle has no regional  wall motion abnormalities. There is mild concentric left ventricular  hypertrophy. Left ventricular diastolic  parameters are indeterminate.   2. Right ventricular systolic function is normal. The right ventricular  size is normal. Tricuspid regurgitation signal is inadequate for assessing  PA pressure.   3. The mitral valve is normal in structure. Mild mitral valve  regurgitation. No evidence of mitral stenosis.   4. The aortic valve is calcified. Aortic valve regurgitation is mild.  Mild to moderate aortic valve stenosis. Aortic valve area, by VTI measures  1.01 cm. Aortic valve mean gradient measures 15.0 mmHg. Aortic valve  Vmax  measures 2.42 m/s.   5. The inferior vena cava is normal in size with greater than 50%  respiratory variability, suggesting right atrial pressure of 3 mmHg.   EKG:  EKG is ordered today.  The ekg ordered today demonstrates normal sinus rhythm, poor R wave progression in anterior leads.  Recent Labs: 12/12/2022: BUN 26; Creatinine, Ser 1.06; Hemoglobin 13.8; Platelets 223; Potassium 4.3; Sodium 141; TSH 0.471  Recent Lipid Panel No results found for: "CHOL", "TRIG", "HDL", "CHOLHDL", "VLDL", "LDLCALC", "LDLDIRECT"   Risk Assessment/Calculations:           Physical Exam:    VS:  BP (!) 146/88   Pulse 78   Ht '5\' 3"'$  (1.6 m)   Wt 137 lb 12.8 oz (62.5 kg)   SpO2 97%   BMI 24.41 kg/m        Wt Readings from Last 3 Encounters:  12/12/22 137 lb 12.8 oz (62.5 kg)  08/06/22 140 lb 3.2 oz (63.6 kg)  02/06/22 140 lb 3.2 oz (63.6 kg)     GEN:  Well nourished, well developed in no acute distress HEENT: Normal NECK: No JVD; No carotid bruits LYMPHATICS: No lymphadenopathy CARDIAC: RRR, no murmurs, rubs, gallops RESPIRATORY:  Clear to auscultation without rales, wheezing or rhonchi  ABDOMEN: Soft,  non-tender, non-distended MUSCULOSKELETAL:  No edema; No deformity  SKIN: Warm and dry NEUROLOGIC:  Alert and oriented x 3 PSYCHIATRIC:  Normal affect   ASSESSMENT:    1. DOE (dyspnea on exertion)   2. Nonrheumatic aortic valve stenosis   3. Hypertension, unspecified type    PLAN:    In order of problems listed above:  Dyspnea on exertion: Obtain blood works to rule out secondary causes including basic metabolic panel, TSH, and CBC.  Case discussed with Dr. Harriet Masson, given lack of chest pain, will hold off on ordering PET stress test.  Will repeat echocardiogram.  She also describes palpitation when she exerts herself, will increase metoprolol to 25 mg daily.  She has a history of frequent SVT seen on previous heart monitor.  Aortic valve disease: Repeat echocardiogram  Hypertension: Blood pressure elevated today, increase metoprolol to 25 mg every night.           Medication Adjustments/Labs and Tests Ordered: Current medicines are reviewed at length with the patient today.  Concerns regarding medicines are outlined above.  Orders Placed This Encounter  Procedures   Basic metabolic panel   TSH   CBC   EKG 12-Lead   ECHOCARDIOGRAM COMPLETE   Meds ordered this encounter  Medications   metoprolol succinate (TOPROL XL) 25 MG 24 hr tablet    Sig: Take 1 tablet (25 mg total) by mouth at bedtime.    Dispense:  90 tablet    Refill:  3    Dose change new Rx    Patient Instructions  Medication Instructions:   INCREASE Metoprolol Succinate to 25 mg-1 tablet by mouth daily   *If you need a refill on your cardiac medications before your next appointment, please call your pharmacy*   Lab Work: Your physician recommends that you return for lab work TODAY:  BMP CBC TSH   If you have labs (blood work) drawn today and your tests are completely normal, you will receive your results only by: [place] (if you have MyChart) OR A paper copy in the mail If you have any lab  test that is abnormal or we need to change your treatment, we will call you to  review the results.  Testing/Procedures: Your physician has requested that you have an echocardiogram. Echocardiography is a painless test that uses sound waves to create images of your heart. It provides your doctor with information about the size and shape of your heart and how well your heart's chambers and valves are working. This procedure takes approximately one hour. There are no restrictions for this procedure. Please do NOT wear cologne, perfume, aftershave, or lotions (deodorant is allowed). Please arrive 15 minutes prior to your appointment time.  Please schedule for 1 month   Follow-Up: At [facility], you and your health needs are our priority.  As part of our continuing mission to provide you with exceptional heart care, we have created designated Provider Care Teams.  These Care Teams include your primary Cardiologist (physician) and Advanced Practice Providers (APPs -  Physician Assistants and Nurse Practitioners) who all work together to provide you with the care you need, when you need it.  We recommend signing up for the patient portal called "MyChart".  Sign up information is provided on this After Visit Summary.  MyChart is used to connect with patients for Virtual Visits (Telemedicine).  Patients are able to view lab/test results, encounter notes, upcoming appointments, etc.  Non-urgent messages can be sent to your provider as well.   To learn more about what you can do with MyChart, go to [email or web address].    Your next appointment:   2-3 month(s)  Provider:   Berniece Salines, DO  or  APP         Other Instructions    Signed, Almyra Deforest, [place]  12/14/2022 11:30 PM    [place]

## 2022-12-12 NOTE — Patient Instructions (Addendum)
Medication Instructions:   INCREASE Metoprolol Succinate to 25 mg-1 tablet by mouth daily   *If you need a refill on your cardiac medications before your next appointment, please call your pharmacy*   Lab Work: Your physician recommends that you return for lab work TODAY:  BMP CBC TSH   If you have labs (blood work) drawn today and your tests are completely normal, you will receive your results only by: [place] (if you have MyChart) OR A paper copy in the mail If you have any lab test that is abnormal or we need to change your treatment, we will call you to review the results.  Testing/Procedures: Your physician has requested that you have an echocardiogram. Echocardiography is a painless test that uses sound waves to create images of your heart. It provides your doctor with information about the size and shape of your heart and how well your heart's chambers and valves are working. This procedure takes approximately one hour. There are no restrictions for this procedure. Please do NOT wear cologne, perfume, aftershave, or lotions (deodorant is allowed). Please arrive 15 minutes prior to your appointment time.  Please schedule for 1 month   Follow-Up: At [facility], you and your health needs are our priority.  As part of our continuing mission to provide you with exceptional heart care, we have created designated Provider Care Teams.  These Care Teams include your primary Cardiologist (physician) and Advanced Practice Providers (APPs -  Physician Assistants and Nurse Practitioners) who all work together to provide you with the care you need, when you need it.  We recommend signing up for the patient portal called "MyChart".  Sign up information is provided on this After Visit Summary.  MyChart is used to connect with patients for Virtual Visits (Telemedicine).  Patients are able to view lab/test results, encounter notes, upcoming appointments, etc.  Non-urgent messages  can be sent to your provider as well.   To learn more about what you can do with MyChart, go to [email or web address].    Your next appointment:   2-3 month(s)  Provider:   Berniece Salines, DO  or  APP         Other Instructions

## 2022-12-13 LAB — BASIC METABOLIC PANEL
BUN/Creatinine Ratio: 25 (ref 12–28)
BUN: 26 mg/dL (ref 8–27)
CO2: 26 mmol/L (ref 20–29)
Calcium: 9.7 mg/dL (ref 8.7–10.3)
Chloride: 100 mmol/L (ref 96–106)
Creatinine, Ser: 1.06 mg/dL — ABNORMAL HIGH (ref 0.57–1.00)
Glucose: 92 mg/dL (ref 70–99)
Potassium: 4.3 mmol/L (ref 3.5–5.2)
Sodium: 141 mmol/L (ref 134–144)
eGFR: 52 mL/min/{1.73_m2} — ABNORMAL LOW (ref >59)

## 2022-12-13 LAB — CBC
Hematocrit: 42.9 % (ref 34.0–46.6)
Hemoglobin: 13.8 g/dL (ref 11.1–15.9)
MCH: 27.2 pg (ref 26.6–33.0)
MCHC: 32.2 g/dL (ref 31.5–35.7)
MCV: 85 fL (ref 79–97)
Platelets: 223 10*3/uL (ref 150–450)
RBC: 5.07 x10E6/uL (ref 3.77–5.28)
RDW: 13.5 % (ref 11.7–15.4)
WBC: 7.8 10*3/uL (ref 3.4–10.8)

## 2022-12-13 LAB — TSH: TSH: 0.471 u[IU]/mL (ref 0.450–4.500)

## 2022-12-13 NOTE — Progress Notes (Signed)
Thyroid function ok, red blood cell count normal, no sign of anemia. White blood cell count normal. Renal function ok, but BUN/Cr ratio slightly high, need to keep adequate hydration.

## 2022-12-14 ENCOUNTER — Encounter: Payer: Self-pay | Admitting: Physician Assistant

## 2023-01-02 ENCOUNTER — Ambulatory Visit
Admission: RE | Admit: 2023-01-02 | Discharge: 2023-01-02 | Disposition: A | Discharge status: Home / Self Care | Payer: Medicare PPO | Source: Ambulatory Visit | Attending: Internal Medicine | Admitting: Internal Medicine

## 2023-01-02 DIAGNOSIS — Z1231 Encounter for screening mammogram for malignant neoplasm of breast: Secondary | ICD-10-CM | POA: Diagnosis not present

## 2023-01-06 ENCOUNTER — Other Ambulatory Visit (HOSPITAL_BASED_OUTPATIENT_CLINIC_OR_DEPARTMENT_OTHER): Payer: Self-pay

## 2023-01-07 ENCOUNTER — Other Ambulatory Visit (HOSPITAL_BASED_OUTPATIENT_CLINIC_OR_DEPARTMENT_OTHER): Payer: Self-pay

## 2023-01-07 MED ORDER — TRAMADOL HCL 50 MG PO TABS
50.0000 mg | ORAL_TABLET | Freq: Three times a day (TID) | ORAL | 1 refills | Status: DC
  Filled 2023-01-07: qty 90, 30d supply, fill #0

## 2023-01-13 ENCOUNTER — Ambulatory Visit (HOSPITAL_COMMUNITY): Payer: Medicare PPO | Attending: Cardiology

## 2023-01-13 DIAGNOSIS — R0609 Other forms of dyspnea: Secondary | ICD-10-CM | POA: Diagnosis not present

## 2023-01-13 DIAGNOSIS — I350 Nonrheumatic aortic (valve) stenosis: Secondary | ICD-10-CM | POA: Diagnosis not present

## 2023-01-13 LAB — ECHOCARDIOGRAM COMPLETE
AR max vel: 1.18 cm2
AV Area VTI: 1.14 cm2
AV Area mean vel: 1.07 cm2
AV Mean grad: 19 mmHg
AV Peak grad: 33.6 mmHg
Ao pk vel: 2.9 m/s
Area-P 1/2: 3.17 cm2
P 1/2 time: 216 msec
S' Lateral: 2.3 cm

## 2023-01-16 ENCOUNTER — Other Ambulatory Visit: Payer: Self-pay

## 2023-01-16 DIAGNOSIS — I350 Nonrheumatic aortic (valve) stenosis: Secondary | ICD-10-CM

## 2023-01-24 ENCOUNTER — Ambulatory Visit: Payer: Medicare PPO | Attending: Cardiology | Admitting: Cardiology

## 2023-01-24 ENCOUNTER — Encounter: Payer: Self-pay | Admitting: Cardiology

## 2023-01-24 VITALS — BP 160/96 | HR 67 | Ht 63.0 in | Wt 137.6 lb

## 2023-01-24 DIAGNOSIS — I471 Supraventricular tachycardia, unspecified: Secondary | ICD-10-CM | POA: Diagnosis not present

## 2023-01-24 DIAGNOSIS — I350 Nonrheumatic aortic (valve) stenosis: Secondary | ICD-10-CM | POA: Diagnosis not present

## 2023-01-24 DIAGNOSIS — I10 Essential (primary) hypertension: Secondary | ICD-10-CM

## 2023-01-24 DIAGNOSIS — I351 Nonrheumatic aortic (valve) insufficiency: Secondary | ICD-10-CM

## 2023-01-24 DIAGNOSIS — I491 Atrial premature depolarization: Secondary | ICD-10-CM | POA: Diagnosis not present

## 2023-01-24 NOTE — Progress Notes (Signed)
Cardiology Office Note:    Date:  01/24/2023   ID:  Monique Wright, DOB Jul 14, 1936, MRN [id number]  PCP:  Monique Wright, Richard, MD  Cardiologist:  Monique RippleKardie Kaydin Karbowski, DO  Electrophysiologist:  None   Referring MD: Monique Wright, Richard, MD   " I am doing well"  History of Present Illness:    Monique MunchCharlotte Q Peine is a 87 y.o. female with a hx of moderate aortic regurgitation, aortic valve stenosis, mitral regurgitation, paroxysmal SVT, PACs here today for follow-up visit.   She reports pain from her scoliosis but no chest pain or shortness of breath  Past Medical History:  Diagnosis Date   Anemia    Arthritis    spinal arthritis   Coronary atherosclerosis of native coronary artery    pt denies having any heart problems and never seen the person who entered this in.   Fatigue    Hypertension    Hypothyroidism    Left ventricular systolic dysfunction    Plantar fasciitis    Scoliosis    Shortness of breath    on exertion    Past Surgical History:  Procedure Laterality Date   ABDOMINAL HYSTERECTOMY     tah and bso   APPENDECTOMY     ENTEROSCOPY N/A 12/21/2013   Procedure: ENTEROSCOPY;  Surgeon: Hilarie FredricksonJohn N Perry, MD;  Location: [facility];  Service: Endoscopy;  Laterality: N/A;   TONSILECTOMY, ADENOIDECTOMY, BILATERAL MYRINGOTOMY AND TUBES     torn cartlidge repair Right yrs ago    Current Medications: Current Meds  Medication Sig   Cholecalciferol (VITAMIN D3) 50 MCG (2000 UT) CAPS Take 50 mcg by mouth daily.   COVID-19 mRNA bivalent vaccine, Pfizer, (PFIZER COVID-19 VAC BIVALENT) injection Inject into the muscle.   COVID-19 mRNA vaccine 2023-2024 (COMIRNATY) syringe Inject 0.3 mLs into the muscle.   influenza vaccine adjuvanted (FLUAD QUADRIVALENT) 0.5 ML injection Inject into the muscle.   influenza vaccine adjuvanted (FLUAD QUADRIVALENT) 0.5 ML injection Inject into the muscle.   levothyroxine (SYNTHROID, LEVOTHROID) 50 MCG tablet Take 1 tablet by mouth daily before breakfast.     LUMIGAN 0.01 % SOLN 1 drop at bedtime.   metoprolol succinate (TOPROL XL) 25 MG 24 hr tablet Take 1 tablet (25 mg total) by mouth at bedtime.   omeprazole (PRILOSEC) 40 MG capsule TAKE 1 CAPSULE BY MOUTH TWICE A DAY   rosuvastatin (CRESTOR) 10 MG tablet Take 10 mg by mouth daily.   traMADol (ULTRAM) 50 MG tablet Take 1 tablet (50 mg total) by mouth 3 (three) times daily.   traZODone (DESYREL) 100 MG tablet Take 100 mg by mouth at bedtime.    valsartan-hydrochlorothiazide (DIOVAN-HCT) 320-25 MG per tablet Take 1 tablet by mouth every morning.      Allergies:   Penicillins, Pneumococcal vac polyvalent, Pneumococcal vaccines, and Prevnar 13 [pneumococcal 13-val conj vacc]   Social History   Socioeconomic History   Marital status: Married    Spouse name: Not on file   Number of children: Not on file   Years of education: Not on file   Highest education level: Not on file  Occupational History   Occupation: retired Teacher, adult educationN    Employer: [employer]  Tobacco Use   Smoking status: Never   Smokeless tobacco: Never  Vaping Use   Vaping Use: Never used  Substance and Sexual Activity   Alcohol use: Yes    Comment: occasional   Drug use: No   Sexual activity: Never    Partners: Male  Other Topics  Concern   Not on file  Social History Narrative   Not on file   Social Determinants of Health   Financial Resource Strain: Not on file  Food Insecurity: Not on file  Transportation Needs: Not on file  Physical Activity: Not on file  Stress: Not on file  Social Connections: Not on file     Family History: The patient's family history includes Heart attack in her mother; Lung cancer in her father. There is no history of Stomach cancer, Colon cancer, or Breast cancer.  ROS:   Review of Systems  Constitution: Negative for decreased appetite, fever and weight gain.  HENT: Negative for congestion, ear discharge, hoarse voice and sore throat.   Eyes: Negative for discharge, redness,  vision loss in right eye and visual halos.  Cardiovascular: Negative for chest pain, dyspnea on exertion, leg swelling, orthopnea and palpitations.  Respiratory: Negative for cough, hemoptysis, shortness of breath and snoring.   Endocrine: Negative for heat intolerance and polyphagia.  Hematologic/Lymphatic: Negative for bleeding problem. Does not bruise/bleed easily.  Skin: Negative for flushing, nail changes, rash and suspicious lesions.  Musculoskeletal: Negative for arthritis, joint pain, muscle cramps, myalgias, neck pain and stiffness.  Gastrointestinal: Negative for abdominal pain, bowel incontinence, diarrhea and excessive appetite.  Genitourinary: Negative for decreased libido, genital sores and incomplete emptying.  Neurological: Negative for brief paralysis, focal weakness, headaches and loss of balance.  Psychiatric/Behavioral: Negative for altered mental status, depression and suicidal ideas.  Allergic/Immunologic: Negative for HIV exposure and persistent infections.    EKGs/Labs/Other Studies Reviewed:    The following studies were reviewed today:   EKG: None today  Tte 01/13/2023 IMPRESSIONS     1. Left ventricular ejection fraction, by estimation, is 60 to 65%. Left  ventricular ejection fraction by 3D volume is 60 %. The left ventricle has  normal function. The left ventricle has no regional wall motion  abnormalities. There is mild left  ventricular hypertrophy. Left ventricular diastolic parameters are  consistent with Grade I diastolic dysfunction (impaired relaxation). The  average left ventricular global longitudinal strain is -19.2 %.   2. Right ventricular systolic function is normal. The right ventricular  size is normal. There is normal pulmonary artery systolic pressure. The  estimated right ventricular systolic pressure is 21.8 mmHg.   3. The mitral valve is normal in structure. Trivial mitral valve  regurgitation. No evidence of mitral stenosis.   4.  The aortic valve is calcified. There is moderate calcification of the  aortic valve. Aortic valve regurgitation is moderate. Moderate aortic  valve stenosis. Vmax 2.9 m/s, MG , AVA 1.1 cm^2, DI 0.32   5. Aortic dilatation noted. There is mild dilatation of the ascending  aorta, measuring 39 mm.   6. The inferior vena cava is normal in size with greater than 50%  respiratory variability, suggesting right atrial pressure of 3 mmHg.   FINDINGS   Left Ventricle: Left ventricular ejection fraction, by estimation, is 60  to 65%. Left ventricular ejection fraction by 3D volume is 60 %. The left  ventricle has normal function. The left ventricle has no regional wall  motion abnormalities. The average  left ventricular global longitudinal strain is -19.2 %. The left  ventricular internal cavity size was small. There is mild left ventricular  hypertrophy. Left ventricular diastolic parameters are consistent with  Grade I diastolic dysfunction (impaired  relaxation).   Right Ventricle: The right ventricular size is normal. No increase in  right ventricular wall thickness. Right  ventricular systolic function is  normal. There is normal pulmonary artery systolic pressure. The tricuspid  regurgitant velocity is 2.17 m/s, and   with an assumed right atrial pressure of 3 mmHg, the estimated right  ventricular systolic pressure is 21.8 mmHg.   Left Atrium: Left atrial size was normal in size.   Right Atrium: Right atrial size was normal in size.   Pericardium: There is no evidence of pericardial effusion.   Mitral Valve: The mitral valve is normal in structure. Trivial mitral  valve regurgitation. No evidence of mitral valve stenosis.   Tricuspid Valve: The tricuspid valve is normal in structure. Tricuspid  valve regurgitation is mild.   Aortic Valve: The aortic valve is calcified. There is moderate  calcification of the aortic valve. Aortic valve regurgitation is moderate.  Aortic  regurgitation PHT measures 216 msec. Moderate aortic stenosis is  present. Aortic valve mean gradient measures   19.0 mmHg. Aortic valve peak gradient measures 33.6 mmHg. Aortic valve  area, by VTI measures 1.14 cm.   Pulmonic Valve: The pulmonic valve was not well visualized. Pulmonic valve  regurgitation is trivial.   Aorta: The aortic root is normal in size and structure and aortic  dilatation noted. There is mild dilatation of the ascending aorta,  measuring 39 mm.   Venous: The inferior vena cava is normal in size with greater than 50%  respiratory variability, suggesting right atrial pressure of 3 mmHg.   IAS/Shunts: The interatrial septum was not well visualized.       TTE 11/09/2021 IMPRESSIONS   1. Left ventricular ejection fraction, by estimation, is 60 to 65%. The  left ventricle has normal function. The left ventricle has no regional  wall motion abnormalities. Left ventricular diastolic parameters are  consistent with Grade I diastolic  dysfunction (impaired relaxation).   2. Right ventricular systolic function is normal. The right ventricular  size is normal.   3. The pericardial effusion is posterior to the left ventricle.   4. The mitral valve is abnormal. Mild mitral valve regurgitation. No  evidence of mitral stenosis.   5. The aortic valve is calcified. There is moderate calcification of the  aortic valve. Aortic valve regurgitation is moderate. Mild to moderate  aortic valve stenosis.   6. The inferior vena cava is normal in size with greater than 50%  respiratory variability, suggesting right atrial pressure of 3 mmHg.   FINDINGS   Left Ventricle: Left ventricular ejection fraction, by estimation, is 60  to 65%. The left ventricle has normal function. The left ventricle has no  regional wall motion abnormalities. The left ventricular internal cavity  size was normal in size. There is   no left ventricular hypertrophy. Left ventricular diastolic  parameters  are consistent with Grade I diastolic dysfunction (impaired relaxation).   Right Ventricle: The right ventricular size is normal. No increase in  right ventricular wall thickness. Right ventricular systolic function is  normal.   Left Atrium: Left atrial size was normal in size.   Right Atrium: Right atrial size was normal in size.   Pericardium: Trivial pericardial effusion is present. The pericardial effusion is posterior to the left ventricle.   Mitral Valve: The mitral valve is abnormal. There is mild thickening of the mitral valve leaflet(s). There is mild calcification of the mitral valve leaflet(s). Mild mitral valve regurgitation. No evidence of mitral valve stenosis.   Tricuspid Valve: The tricuspid valve is normal in structure. Tricuspid valve regurgitation is mild . No evidence of  tricuspid stenosis.   Aortic Valve: The aortic valve is calcified. There is moderate calcification of the aortic valve. Aortic valve regurgitation is moderate. Aortic regurgitation PHT measures 927 msec. Mild to moderate aortic  stenosis is present. Aortic valve mean gradient  measures 10.3 mmHg. Aortic valve peak gradient measures 18.7 mmHg. Aortic  valve area, by VTI measures 1.10 cm.   Pulmonic Valve: The pulmonic valve was normal in structure. Pulmonic valve  regurgitation is trivial. No evidence of pulmonic stenosis.   Aorta: The aortic root is normal in size and structure.   Venous: The inferior vena cava is normal in size with greater than 50%  respiratory variability, suggesting right atrial pressure of 3 mmHg.   IAS/Shunts: No atrial level shunt detected by color flow Doppler.      Zio monitor  Patch Wear Time:  10 days and 9 hours (2023-01-05T10:25:46-0500 to 2023-01-15T20:15:44-0500)   Indication: Premature ventricular complexes.   Patient had a min HR of 55 bpm, max HR of 190 bpm, and avg HR of 78 bpm. Predominant underlying rhythm was Sinus Rhythm.    442  Supraventricular Tachycardia runs occurred, the run with the fastest interval lasting 4 beats with a max rate of 190 bpm, the  longest lasting 33.0 secs with an avg rate of 144 bpm. Supraventricular Tachycardia was detected within +/- 45 seconds of symptomatic patient event(s). Isolated SVEs were occasional (1.3%, 14444), SVE Couplets were rare (<1.0%, 1342), and SVE Triplets  were rare (<1.0%, 405). Isolated VEs were rare (<1.0%), and no VE Couplets or VE Triplets were present.   Symptoms associated with supraventricular tachycardia and premature ventricular complex.  Conclusion: This study is remarkable for the following:                                  1.  Symptomatic paroxysmal supraventricular tachycardia.                                   2.  Symptomatic occasional premature atrial complexes.    Recent Labs: 12/12/2022: BUN 26; Creatinine, Ser 1.06; Hemoglobin 13.8; Platelets 223; Potassium 4.3; Sodium 141; TSH 0.471  Recent Lipid Panel No results found for: "CHOL", "TRIG", "HDL", "CHOLHDL", "VLDL", "LDLCALC", "LDLDIRECT"  Physical Exam:    VS:  BP (!) 160/96   Pulse 67   Ht 5\' 3"  (1.6 m)   Wt 137 lb 9.6 oz (62.4 kg)   SpO2 98%   BMI 24.37 kg/m     Wt Readings from Last 3 Encounters:  01/24/23 137 lb 9.6 oz (62.4 kg)  12/12/22 137 lb 12.8 oz (62.5 kg)  08/06/22 140 lb 3.2 oz (63.6 kg)     GEN: Well nourished, well developed in no acute distress HEENT: Normal NECK: No JVD; No carotid bruits LYMPHATICS: No lymphadenopathy CARDIAC: S1S2 noted,RRR, no murmurs, rubs, gallops RESPIRATORY:  Clear to auscultation without rales, wheezing or rhonchi  ABDOMEN: Soft, non-tender, non-distended, +bowel sounds, no guarding. EXTREMITIES: No edema, No cyanosis, no clubbing MUSCULOSKELETAL:  No deformity  SKIN: Warm and dry NEUROLOGIC:  Alert and oriented x 3, non-focal PSYCHIATRIC:  Normal affect, good insight  ASSESSMENT:    1. Moderate aortic regurgitation   2. Moderate aortic  stenosis   3. Hypertension, unspecified type   4. PSVT (paroxysmal supraventricular tachycardia)   5. PAC (premature atrial contraction)  PLAN:     Hypertensive in the office today. She tells me that at home her systolic blood pressure is in the 130s . She will take her blood pressure daily and send me that information in 1 week. Will hold off on adding additional medication to avoid medication induced hypertension .    No medication changes today. We will continue her beta-blocker.  She is asymptomatic we will continue to monitor the patient closely.  Repeat echo in 1 year.  The patient is in agreement with the above plan. The patient left the office in stable condition.  The patient will follow up in   Medication Adjustments/Labs and Tests Ordered: Current medicines are reviewed at length with the patient today.  Concerns regarding medicines are outlined above.  No orders of the defined types were placed in this encounter.  No orders of the defined types were placed in this encounter.   Patient Instructions  Medication Instructions:  Your physician recommends that you continue on your current medications as directed. Please refer to the Current Medication list given to you today.   Please take your blood pressure daily and call the office the morning of Friday April 12th. Please include heart rates.   HOW TO TAKE YOUR BLOOD PRESSURE: Rest 5 minutes before taking your blood pressure. Don't smoke or drink caffeinated beverages for at least 30 minutes before. Take your blood pressure before (not after) you eat. Sit comfortably with your back supported and both feet on the floor (don't cross your legs). Elevate your arm to heart level on a table or a desk. Use the proper sized cuff. It should fit smoothly and snugly around your bare upper arm. There should be enough room to slip a fingertip under the cuff. The bottom edge of the cuff should be 1 inch above the crease of the  elbow. Ideally, take 3 measurements at one sitting and record the average.  *If you need a refill on your cardiac medications before your next appointment, please call your pharmacy*   Lab Work: None   Testing/Procedures: None   Follow-Up: At [facility], you and your health needs are our priority.  As part of our continuing mission to provide you with exceptional heart care, we have created designated Provider Care Teams.  These Care Teams include your primary Cardiologist (physician) and Advanced Practice Providers (APPs -  Physician Assistants and Nurse Practitioners) who all work together to provide you with the care you need, when you need it.   Your next appointment:   6 month(s)  Provider:   Thomasene RippleKardie Yuko Coventry, DO     Adopting a Healthy Lifestyle.  Know what a healthy weight is for you (roughly BMI <25) and aim to maintain this   Aim for 7+ servings of fruits and vegetables daily   65-80+ fluid ounces of water or unsweet tea for healthy kidneys   Limit to max 1 drink of alcohol per day; avoid smoking/tobacco   Limit animal fats in diet for cholesterol and heart health - choose grass fed whenever available   Avoid highly processed foods, and foods high in saturated/trans fats   Aim for low stress - take time to unwind and care for your mental health   Aim for 150 min of moderate intensity exercise weekly for heart health, and weights twice weekly for bone health   Aim for 7-9 hours of sleep daily   When it comes to diets, agreement about the perfect plan isnt easy to  find, even among the experts. Experts at the [facility] [employer] developed an idea known as the Healthy Eating Plate. Just imagine a plate divided into logical, healthy portions.   The emphasis is on diet quality:   Load up on vegetables and fruits - one-half of your plate: Aim for color and variety, and remember that potatoes dont count.   Go for whole grains - one-quarter of  your plate: Whole wheat, barley, wheat berries, quinoa, oats, brown rice, and foods made with them. If you want pasta, go with whole wheat pasta.   Protein power - one-quarter of your plate: Fish, chicken, beans, and nuts are all healthy, versatile protein sources. Limit red meat.   The diet, however, does go beyond the plate, offering a few other suggestions.   Use healthy plant oils, such as olive, canola, soy, corn, sunflower and peanut. Check the labels, and avoid partially hydrogenated oil, which have unhealthy trans fats.   If youre thirsty, drink water. Coffee and tea are good in moderation, but skip sugary drinks and limit milk and dairy products to one or two daily servings.   The type of carbohydrate in the diet is more important than the amount. Some sources of carbohydrates, such as vegetables, fruits, whole grains, and beans-are healthier than others.   Finally, stay active  Signed, Monique Ripple, DO  01/24/2023 11:40 PM    [facility]

## 2023-01-24 NOTE — Patient Instructions (Signed)
Medication Instructions:  Your physician recommends that you continue on your current medications as directed. Please refer to the Current Medication list given to you today.   Please take your blood pressure daily and call the office the morning of Friday April 12th. Please include heart rates.   HOW TO TAKE YOUR BLOOD PRESSURE: Rest 5 minutes before taking your blood pressure. Don't smoke or drink caffeinated beverages for at least 30 minutes before. Take your blood pressure before (not after) you eat. Sit comfortably with your back supported and both feet on the floor (don't cross your legs). Elevate your arm to heart level on a table or a desk. Use the proper sized cuff. It should fit smoothly and snugly around your bare upper arm. There should be enough room to slip a fingertip under the cuff. The bottom edge of the cuff should be 1 inch above the crease of the elbow. Ideally, take 3 measurements at one sitting and record the average.  *If you need a refill on your cardiac medications before your next appointment, please call your pharmacy*   Lab Work: None   Testing/Procedures: None   Follow-Up: At [facility], you and your health needs are our priority.  As part of our continuing mission to provide you with exceptional heart care, we have created designated Provider Care Teams.  These Care Teams include your primary Cardiologist (physician) and Advanced Practice Providers (APPs -  Physician Assistants and Nurse Practitioners) who all work together to provide you with the care you need, when you need it.   Your next appointment:   6 month(s)  Provider:   Thomasene Ripple, DO

## 2023-01-28 DIAGNOSIS — M47816 Spondylosis without myelopathy or radiculopathy, lumbar region: Secondary | ICD-10-CM | POA: Diagnosis not present

## 2023-02-10 ENCOUNTER — Other Ambulatory Visit (HOSPITAL_BASED_OUTPATIENT_CLINIC_OR_DEPARTMENT_OTHER): Payer: Self-pay

## 2023-02-10 DIAGNOSIS — M791 Myalgia, unspecified site: Secondary | ICD-10-CM | POA: Diagnosis not present

## 2023-02-10 DIAGNOSIS — M47816 Spondylosis without myelopathy or radiculopathy, lumbar region: Secondary | ICD-10-CM | POA: Diagnosis not present

## 2023-02-10 MED ORDER — TRAMADOL HCL 50 MG PO TABS
50.0000 mg | ORAL_TABLET | Freq: Three times a day (TID) | ORAL | 1 refills | Status: DC
  Filled 2023-02-10 – 2023-02-24 (×2): qty 90, 30d supply, fill #0
  Filled 2023-04-18: qty 90, 30d supply, fill #1

## 2023-02-11 ENCOUNTER — Telehealth: Payer: Self-pay | Admitting: Cardiology

## 2023-02-11 NOTE — Telephone Encounter (Signed)
Patient calling with her bp numbers:  4/10: 136/76 HR 63 4/11: 145/80 HR 68 4/12: 150/89 HR 68 4/13: 135/76 HR 63 4/14: 146/82 HR 65 4/15: 140/74 HR 61 4/16: 134/78 HR 62

## 2023-02-18 ENCOUNTER — Other Ambulatory Visit (HOSPITAL_BASED_OUTPATIENT_CLINIC_OR_DEPARTMENT_OTHER): Payer: Self-pay

## 2023-02-18 NOTE — Telephone Encounter (Signed)
Patient is aware of provider message. Verbalized understanding.

## 2023-02-24 ENCOUNTER — Other Ambulatory Visit (HOSPITAL_BASED_OUTPATIENT_CLINIC_OR_DEPARTMENT_OTHER): Payer: Self-pay

## 2023-02-26 DIAGNOSIS — M47816 Spondylosis without myelopathy or radiculopathy, lumbar region: Secondary | ICD-10-CM | POA: Diagnosis not present

## 2023-02-26 DIAGNOSIS — M7062 Trochanteric bursitis, left hip: Secondary | ICD-10-CM | POA: Diagnosis not present

## 2023-02-26 DIAGNOSIS — M25552 Pain in left hip: Secondary | ICD-10-CM | POA: Diagnosis not present

## 2023-02-26 DIAGNOSIS — M791 Myalgia, unspecified site: Secondary | ICD-10-CM | POA: Diagnosis not present

## 2023-03-21 DIAGNOSIS — H04123 Dry eye syndrome of bilateral lacrimal glands: Secondary | ICD-10-CM | POA: Diagnosis not present

## 2023-03-28 DIAGNOSIS — M1612 Unilateral primary osteoarthritis, left hip: Secondary | ICD-10-CM | POA: Diagnosis not present

## 2023-04-07 DIAGNOSIS — E785 Hyperlipidemia, unspecified: Secondary | ICD-10-CM | POA: Diagnosis not present

## 2023-04-07 DIAGNOSIS — E039 Hypothyroidism, unspecified: Secondary | ICD-10-CM | POA: Diagnosis not present

## 2023-04-07 DIAGNOSIS — D649 Anemia, unspecified: Secondary | ICD-10-CM | POA: Diagnosis not present

## 2023-04-07 DIAGNOSIS — K219 Gastro-esophageal reflux disease without esophagitis: Secondary | ICD-10-CM | POA: Diagnosis not present

## 2023-04-07 DIAGNOSIS — I10 Essential (primary) hypertension: Secondary | ICD-10-CM | POA: Diagnosis not present

## 2023-04-08 DIAGNOSIS — M2042 Other hammer toe(s) (acquired), left foot: Secondary | ICD-10-CM | POA: Diagnosis not present

## 2023-04-08 DIAGNOSIS — M2041 Other hammer toe(s) (acquired), right foot: Secondary | ICD-10-CM | POA: Diagnosis not present

## 2023-04-08 DIAGNOSIS — M79672 Pain in left foot: Secondary | ICD-10-CM | POA: Diagnosis not present

## 2023-04-08 DIAGNOSIS — M2011 Hallux valgus (acquired), right foot: Secondary | ICD-10-CM | POA: Diagnosis not present

## 2023-04-08 DIAGNOSIS — M79671 Pain in right foot: Secondary | ICD-10-CM | POA: Diagnosis not present

## 2023-04-08 DIAGNOSIS — B351 Tinea unguium: Secondary | ICD-10-CM | POA: Diagnosis not present

## 2023-04-09 DIAGNOSIS — M47816 Spondylosis without myelopathy or radiculopathy, lumbar region: Secondary | ICD-10-CM | POA: Diagnosis not present

## 2023-04-14 DIAGNOSIS — Z1339 Encounter for screening examination for other mental health and behavioral disorders: Secondary | ICD-10-CM | POA: Diagnosis not present

## 2023-04-14 DIAGNOSIS — R82998 Other abnormal findings in urine: Secondary | ICD-10-CM | POA: Diagnosis not present

## 2023-04-14 DIAGNOSIS — I10 Essential (primary) hypertension: Secondary | ICD-10-CM | POA: Diagnosis not present

## 2023-04-14 DIAGNOSIS — E039 Hypothyroidism, unspecified: Secondary | ICD-10-CM | POA: Diagnosis not present

## 2023-04-14 DIAGNOSIS — E785 Hyperlipidemia, unspecified: Secondary | ICD-10-CM | POA: Diagnosis not present

## 2023-04-14 DIAGNOSIS — Z0000 Encounter for general adult medical examination without abnormal findings: Secondary | ICD-10-CM | POA: Diagnosis not present

## 2023-04-14 DIAGNOSIS — I350 Nonrheumatic aortic (valve) stenosis: Secondary | ICD-10-CM | POA: Diagnosis not present

## 2023-04-14 DIAGNOSIS — D692 Other nonthrombocytopenic purpura: Secondary | ICD-10-CM | POA: Diagnosis not present

## 2023-04-14 DIAGNOSIS — Z1331 Encounter for screening for depression: Secondary | ICD-10-CM | POA: Diagnosis not present

## 2023-04-14 DIAGNOSIS — K219 Gastro-esophageal reflux disease without esophagitis: Secondary | ICD-10-CM | POA: Diagnosis not present

## 2023-04-14 DIAGNOSIS — M419 Scoliosis, unspecified: Secondary | ICD-10-CM | POA: Diagnosis not present

## 2023-04-18 ENCOUNTER — Other Ambulatory Visit: Payer: Self-pay

## 2023-04-23 DIAGNOSIS — M7062 Trochanteric bursitis, left hip: Secondary | ICD-10-CM | POA: Diagnosis not present

## 2023-04-25 DIAGNOSIS — M7062 Trochanteric bursitis, left hip: Secondary | ICD-10-CM | POA: Diagnosis not present

## 2023-04-29 DIAGNOSIS — D1801 Hemangioma of skin and subcutaneous tissue: Secondary | ICD-10-CM | POA: Diagnosis not present

## 2023-04-29 DIAGNOSIS — L570 Actinic keratosis: Secondary | ICD-10-CM | POA: Diagnosis not present

## 2023-04-29 DIAGNOSIS — L821 Other seborrheic keratosis: Secondary | ICD-10-CM | POA: Diagnosis not present

## 2023-04-29 DIAGNOSIS — L720 Epidermal cyst: Secondary | ICD-10-CM | POA: Diagnosis not present

## 2023-04-29 DIAGNOSIS — L814 Other melanin hyperpigmentation: Secondary | ICD-10-CM | POA: Diagnosis not present

## 2023-04-29 DIAGNOSIS — D692 Other nonthrombocytopenic purpura: Secondary | ICD-10-CM | POA: Diagnosis not present

## 2023-05-01 ENCOUNTER — Other Ambulatory Visit: Payer: Self-pay | Admitting: Cardiology

## 2023-05-06 DIAGNOSIS — M47816 Spondylosis without myelopathy or radiculopathy, lumbar region: Secondary | ICD-10-CM | POA: Diagnosis not present

## 2023-05-26 DIAGNOSIS — M7062 Trochanteric bursitis, left hip: Secondary | ICD-10-CM | POA: Diagnosis not present

## 2023-06-10 ENCOUNTER — Other Ambulatory Visit (HOSPITAL_BASED_OUTPATIENT_CLINIC_OR_DEPARTMENT_OTHER): Payer: Self-pay

## 2023-06-10 ENCOUNTER — Other Ambulatory Visit: Payer: Self-pay

## 2023-06-10 DIAGNOSIS — M47816 Spondylosis without myelopathy or radiculopathy, lumbar region: Secondary | ICD-10-CM | POA: Diagnosis not present

## 2023-06-10 DIAGNOSIS — M1612 Unilateral primary osteoarthritis, left hip: Secondary | ICD-10-CM | POA: Diagnosis not present

## 2023-06-10 MED ORDER — TRAMADOL HCL 50 MG PO TABS
50.0000 mg | ORAL_TABLET | Freq: Three times a day (TID) | ORAL | 1 refills | Status: DC
  Filled 2023-06-10: qty 90, 30d supply, fill #0
  Filled 2023-08-20: qty 90, 30d supply, fill #1

## 2023-06-11 ENCOUNTER — Other Ambulatory Visit: Payer: Self-pay | Admitting: *Deleted

## 2023-06-11 DIAGNOSIS — M1612 Unilateral primary osteoarthritis, left hip: Secondary | ICD-10-CM

## 2023-07-03 ENCOUNTER — Encounter: Payer: Self-pay | Admitting: *Deleted

## 2023-07-03 DIAGNOSIS — H401131 Primary open-angle glaucoma, bilateral, mild stage: Secondary | ICD-10-CM | POA: Diagnosis not present

## 2023-07-03 DIAGNOSIS — H524 Presbyopia: Secondary | ICD-10-CM | POA: Diagnosis not present

## 2023-07-04 ENCOUNTER — Encounter: Payer: Self-pay | Admitting: *Deleted

## 2023-07-06 ENCOUNTER — Other Ambulatory Visit: Payer: Medicare PPO

## 2023-07-14 ENCOUNTER — Ambulatory Visit
Admission: RE | Admit: 2023-07-14 | Discharge: 2023-07-14 | Disposition: A | Discharge status: Home / Self Care | Payer: Self-pay | Source: Ambulatory Visit | Attending: *Deleted | Admitting: *Deleted

## 2023-07-14 DIAGNOSIS — M1612 Unilateral primary osteoarthritis, left hip: Secondary | ICD-10-CM

## 2023-07-14 DIAGNOSIS — M25552 Pain in left hip: Secondary | ICD-10-CM | POA: Diagnosis not present

## 2023-07-14 DIAGNOSIS — M7602 Gluteal tendinitis, left hip: Secondary | ICD-10-CM | POA: Diagnosis not present

## 2023-07-16 DIAGNOSIS — M7062 Trochanteric bursitis, left hip: Secondary | ICD-10-CM | POA: Diagnosis not present

## 2023-07-18 ENCOUNTER — Other Ambulatory Visit: Payer: Medicare PPO

## 2023-07-21 ENCOUNTER — Other Ambulatory Visit: Payer: Medicare PPO

## 2023-07-29 DIAGNOSIS — M25651 Stiffness of right hip, not elsewhere classified: Secondary | ICD-10-CM | POA: Diagnosis not present

## 2023-07-29 DIAGNOSIS — M79605 Pain in left leg: Secondary | ICD-10-CM | POA: Diagnosis not present

## 2023-07-29 DIAGNOSIS — M7062 Trochanteric bursitis, left hip: Secondary | ICD-10-CM | POA: Diagnosis not present

## 2023-07-29 DIAGNOSIS — M25652 Stiffness of left hip, not elsewhere classified: Secondary | ICD-10-CM | POA: Diagnosis not present

## 2023-08-04 DIAGNOSIS — M25652 Stiffness of left hip, not elsewhere classified: Secondary | ICD-10-CM | POA: Diagnosis not present

## 2023-08-04 DIAGNOSIS — M25651 Stiffness of right hip, not elsewhere classified: Secondary | ICD-10-CM | POA: Diagnosis not present

## 2023-08-04 DIAGNOSIS — M7062 Trochanteric bursitis, left hip: Secondary | ICD-10-CM | POA: Diagnosis not present

## 2023-08-04 DIAGNOSIS — M79605 Pain in left leg: Secondary | ICD-10-CM | POA: Diagnosis not present

## 2023-08-08 DIAGNOSIS — M25651 Stiffness of right hip, not elsewhere classified: Secondary | ICD-10-CM | POA: Diagnosis not present

## 2023-08-08 DIAGNOSIS — M79605 Pain in left leg: Secondary | ICD-10-CM | POA: Diagnosis not present

## 2023-08-08 DIAGNOSIS — M7062 Trochanteric bursitis, left hip: Secondary | ICD-10-CM | POA: Diagnosis not present

## 2023-08-08 DIAGNOSIS — M25652 Stiffness of left hip, not elsewhere classified: Secondary | ICD-10-CM | POA: Diagnosis not present

## 2023-08-11 DIAGNOSIS — M7062 Trochanteric bursitis, left hip: Secondary | ICD-10-CM | POA: Diagnosis not present

## 2023-08-11 DIAGNOSIS — M25652 Stiffness of left hip, not elsewhere classified: Secondary | ICD-10-CM | POA: Diagnosis not present

## 2023-08-11 DIAGNOSIS — M25651 Stiffness of right hip, not elsewhere classified: Secondary | ICD-10-CM | POA: Diagnosis not present

## 2023-08-11 DIAGNOSIS — M79605 Pain in left leg: Secondary | ICD-10-CM | POA: Diagnosis not present

## 2023-08-15 ENCOUNTER — Other Ambulatory Visit (HOSPITAL_BASED_OUTPATIENT_CLINIC_OR_DEPARTMENT_OTHER): Payer: Self-pay

## 2023-08-15 DIAGNOSIS — M25652 Stiffness of left hip, not elsewhere classified: Secondary | ICD-10-CM | POA: Diagnosis not present

## 2023-08-15 DIAGNOSIS — M79605 Pain in left leg: Secondary | ICD-10-CM | POA: Diagnosis not present

## 2023-08-15 DIAGNOSIS — M25651 Stiffness of right hip, not elsewhere classified: Secondary | ICD-10-CM | POA: Diagnosis not present

## 2023-08-15 DIAGNOSIS — M7062 Trochanteric bursitis, left hip: Secondary | ICD-10-CM | POA: Diagnosis not present

## 2023-08-15 MED ORDER — INFLUENZA VAC A&B SURF ANT ADJ 0.5 ML IM SUSY
0.5000 mL | PREFILLED_SYRINGE | Freq: Once | INTRAMUSCULAR | 0 refills | Status: AC
  Filled 2023-08-15: qty 0.5, 1d supply, fill #0

## 2023-08-18 DIAGNOSIS — M7062 Trochanteric bursitis, left hip: Secondary | ICD-10-CM | POA: Diagnosis not present

## 2023-08-18 DIAGNOSIS — M79605 Pain in left leg: Secondary | ICD-10-CM | POA: Diagnosis not present

## 2023-08-18 DIAGNOSIS — M25651 Stiffness of right hip, not elsewhere classified: Secondary | ICD-10-CM | POA: Diagnosis not present

## 2023-08-18 DIAGNOSIS — M25652 Stiffness of left hip, not elsewhere classified: Secondary | ICD-10-CM | POA: Diagnosis not present

## 2023-08-19 DIAGNOSIS — Z79899 Other long term (current) drug therapy: Secondary | ICD-10-CM | POA: Diagnosis not present

## 2023-08-19 DIAGNOSIS — Z79891 Long term (current) use of opiate analgesic: Secondary | ICD-10-CM | POA: Diagnosis not present

## 2023-08-19 DIAGNOSIS — M7062 Trochanteric bursitis, left hip: Secondary | ICD-10-CM | POA: Diagnosis not present

## 2023-08-19 DIAGNOSIS — G894 Chronic pain syndrome: Secondary | ICD-10-CM | POA: Diagnosis not present

## 2023-08-20 ENCOUNTER — Other Ambulatory Visit: Payer: Self-pay

## 2023-08-20 ENCOUNTER — Ambulatory Visit: Payer: Medicare PPO | Attending: Cardiology | Admitting: Cardiology

## 2023-08-20 ENCOUNTER — Encounter: Payer: Self-pay | Admitting: Cardiology

## 2023-08-20 VITALS — BP 210/160 | HR 60 | Ht 63.0 in | Wt 138.6 lb

## 2023-08-20 DIAGNOSIS — I491 Atrial premature depolarization: Secondary | ICD-10-CM

## 2023-08-20 DIAGNOSIS — I471 Supraventricular tachycardia, unspecified: Secondary | ICD-10-CM | POA: Diagnosis not present

## 2023-08-20 DIAGNOSIS — I10 Essential (primary) hypertension: Secondary | ICD-10-CM | POA: Diagnosis not present

## 2023-08-20 MED ORDER — CARVEDILOL 6.25 MG PO TABS: 6.2500 mg | ORAL_TABLET | Freq: Two times a day (BID) | ORAL | 3 refills | Status: DC

## 2023-08-20 NOTE — Progress Notes (Signed)
Cardiology Office Note:    Date:  08/20/2023   ID:  ANGLEA Wright, DOB 06-21-1936, MRN [id number]  PCP:  Monique Paradise, MD  Cardiologist:  Monique Ripple, DO  Electrophysiologist:  None   Referring MD: Monique Paradise, MD   " I am doing well"  History of Present Illness:    Monique Wright is a 87 y.o. female with a hx of moderate aortic regurgitation, aortic valve stenosis, mitral regurgitation, paroxysmal SVT, PACs here today for follow-up visit.   ' She has been using an automatic blood pressure cuff at home, which the doctor plans to evaluate at the next visit. The patient has been experiencing bursitis over the past six weeks, which was treated with a cortisone injection and physical therapy twice weekly. The physical therapist recommended increased use of Voltaren, which the patient has been using 'once in a while.' The patient is concerned that the increased use of Voltaren may be contributing to the elevated blood pressure readings.  No chest pain or shortness of breath.  Past Medical History:  Diagnosis Date   Anemia    Arthritis    spinal arthritis   Coronary atherosclerosis of native coronary artery    pt denies having any heart problems and never seen the person who entered this in.   Fatigue    Hypertension    Hypothyroidism    Left ventricular systolic dysfunction    Plantar fasciitis    Scoliosis    Shortness of breath    on exertion    Past Surgical History:  Procedure Laterality Date   ABDOMINAL HYSTERECTOMY     tah and bso   APPENDECTOMY     ENTEROSCOPY N/A 12/21/2013   Procedure: ENTEROSCOPY;  Surgeon: Monique Fredrickson, MD;  Location: [facility];  Service: Endoscopy;  Laterality: N/A;   TONSILECTOMY, ADENOIDECTOMY, BILATERAL MYRINGOTOMY AND TUBES     torn cartlidge repair Right yrs ago    Current Medications: Current Meds  Medication Sig   carvedilol (COREG) 6.25 MG tablet Take 1 tablet (6.25 mg total) by mouth 2 (two) times daily.    Cholecalciferol (VITAMIN D3) 50 MCG (2000 UT) CAPS Take 50 mcg by mouth daily.   levothyroxine (SYNTHROID, LEVOTHROID) 50 MCG tablet Take 1 tablet by mouth daily before breakfast.    LUMIGAN 0.01 % SOLN 1 drop at bedtime.   omeprazole (PRILOSEC) 40 MG capsule TAKE 1 CAPSULE BY MOUTH TWICE A DAY   rosuvastatin (CRESTOR) 10 MG tablet Take 10 mg by mouth daily.   traMADol (ULTRAM) 50 MG tablet Take 1 tablet (50 mg total) by mouth 3 (three) times daily.   traZODone (DESYREL) 100 MG tablet Take 100 mg by mouth at bedtime.    valsartan-hydrochlorothiazide (DIOVAN-HCT) 320-25 MG per tablet Take 1 tablet by mouth every morning.    [DISCONTINUED] metoprolol succinate (TOPROL-XL) 25 MG 24 hr tablet TAKE 1/2 TABLET(12.5 MG) BY MOUTH AT BEDTIME     Allergies:   Penicillins, Pneumococcal vac polyvalent, Pneumococcal vaccines, and Prevnar 13 [pneumococcal 13-val conj vacc]   Social History   Socioeconomic History   Marital status: Married    Spouse name: Not on file   Number of children: Not on file   Years of education: Not on file   Highest education level: Not on file  Occupational History   Occupation: retired Teacher, adult education: [employer]  Tobacco Use   Smoking status: Never   Smokeless tobacco: Never  Vaping Use   Vaping status:  Never Used  Substance and Sexual Activity   Alcohol use: Yes    Comment: occasional   Drug use: No   Sexual activity: Never    Partners: Male  Other Topics Concern   Not on file  Social History Narrative   Not on file   Social Determinants of Health   Financial Resource Strain: Not on file  Food Insecurity: Not on file  Transportation Needs: Not on file  Physical Activity: Not on file  Stress: Not on file  Social Connections: Not on file     Family History: The patient's family history includes Heart attack in her mother; Lung cancer in her father. There is no history of Stomach cancer, Colon cancer, or Breast cancer.  ROS:   Review of  Systems  Constitution: Negative for decreased appetite, fever and weight gain.  HENT: Negative for congestion, ear discharge, hoarse voice and sore throat.   Eyes: Negative for discharge, redness, vision loss in right eye and visual halos.  Cardiovascular: Negative for chest pain, dyspnea on exertion, leg swelling, orthopnea and palpitations.  Respiratory: Negative for cough, hemoptysis, shortness of breath and snoring.   Endocrine: Negative for heat intolerance and polyphagia.  Hematologic/Lymphatic: Negative for bleeding problem. Does not bruise/bleed easily.  Skin: Negative for flushing, nail changes, rash and suspicious lesions.  Musculoskeletal: Negative for arthritis, joint pain, muscle cramps, myalgias, neck pain and stiffness.  Gastrointestinal: Negative for abdominal pain, bowel incontinence, diarrhea and excessive appetite.  Genitourinary: Negative for decreased libido, genital sores and incomplete emptying.  Neurological: Negative for brief paralysis, focal weakness, headaches and loss of balance.  Psychiatric/Behavioral: Negative for altered mental status, depression and suicidal ideas.  Allergic/Immunologic: Negative for HIV exposure and persistent infections.    EKGs/Labs/Other Studies Reviewed:    The following studies were reviewed today:   EKG: None today  Tte 01/13/2023 IMPRESSIONS     1. Left ventricular ejection fraction, by estimation, is 60 to 65%. Left  ventricular ejection fraction by 3D volume is 60 %. The left ventricle has  normal function. The left ventricle has no regional wall motion  abnormalities. There is mild left  ventricular hypertrophy. Left ventricular diastolic parameters are  consistent with Grade I diastolic dysfunction (impaired relaxation). The  average left ventricular global longitudinal strain is -19.2 %.   2. Right ventricular systolic function is normal. The right ventricular  size is normal. There is normal pulmonary artery systolic  pressure. The  estimated right ventricular systolic pressure is 21.8 mmHg.   3. The mitral valve is normal in structure. Trivial mitral valve  regurgitation. No evidence of mitral stenosis.   4. The aortic valve is calcified. There is moderate calcification of the  aortic valve. Aortic valve regurgitation is moderate. Moderate aortic  valve stenosis. Vmax 2.9 m/s, MG , AVA 1.1 cm^2, DI 0.32   5. Aortic dilatation noted. There is mild dilatation of the ascending  aorta, measuring 39 mm.   6. The inferior vena cava is normal in size with greater than 50%  respiratory variability, suggesting right atrial pressure of 3 mmHg.   FINDINGS   Left Ventricle: Left ventricular ejection fraction, by estimation, is 60  to 65%. Left ventricular ejection fraction by 3D volume is 60 %. The left  ventricle has normal function. The left ventricle has no regional wall  motion abnormalities. The average  left ventricular global longitudinal strain is -19.2 %. The left  ventricular internal cavity size was small. There is mild left ventricular  hypertrophy. Left ventricular diastolic parameters are consistent with  Grade I diastolic dysfunction (impaired  relaxation).   Right Ventricle: The right ventricular size is normal. No increase in  right ventricular wall thickness. Right ventricular systolic function is  normal. There is normal pulmonary artery systolic pressure. The tricuspid  regurgitant velocity is 2.17 m/s, and   with an assumed right atrial pressure of 3 mmHg, the estimated right  ventricular systolic pressure is 21.8 mmHg.   Left Atrium: Left atrial size was normal in size.   Right Atrium: Right atrial size was normal in size.   Pericardium: There is no evidence of pericardial effusion.   Mitral Valve: The mitral valve is normal in structure. Trivial mitral  valve regurgitation. No evidence of mitral valve stenosis.   Tricuspid Valve: The tricuspid valve is normal in structure.  Tricuspid  valve regurgitation is mild.   Aortic Valve: The aortic valve is calcified. There is moderate  calcification of the aortic valve. Aortic valve regurgitation is moderate.  Aortic regurgitation PHT measures 216 msec. Moderate aortic stenosis is  present. Aortic valve mean gradient measures   19.0 mmHg. Aortic valve peak gradient measures 33.6 mmHg. Aortic valve  area, by VTI measures 1.14 cm.   Pulmonic Valve: The pulmonic valve was not well visualized. Pulmonic valve  regurgitation is trivial.   Aorta: The aortic root is normal in size and structure and aortic  dilatation noted. There is mild dilatation of the ascending aorta,  measuring 39 mm.   Venous: The inferior vena cava is normal in size with greater than 50%  respiratory variability, suggesting right atrial pressure of 3 mmHg.   IAS/Shunts: The interatrial septum was not well visualized.       TTE 11/09/2021 IMPRESSIONS   1. Left ventricular ejection fraction, by estimation, is 60 to 65%. The  left ventricle has normal function. The left ventricle has no regional  wall motion abnormalities. Left ventricular diastolic parameters are  consistent with Grade I diastolic  dysfunction (impaired relaxation).   2. Right ventricular systolic function is normal. The right ventricular  size is normal.   3. The pericardial effusion is posterior to the left ventricle.   4. The mitral valve is abnormal. Mild mitral valve regurgitation. No  evidence of mitral stenosis.   5. The aortic valve is calcified. There is moderate calcification of the  aortic valve. Aortic valve regurgitation is moderate. Mild to moderate  aortic valve stenosis.   6. The inferior vena cava is normal in size with greater than 50%  respiratory variability, suggesting right atrial pressure of 3 mmHg.   FINDINGS   Left Ventricle: Left ventricular ejection fraction, by estimation, is 60  to 65%. The left ventricle has normal function. The left  ventricle has no  regional wall motion abnormalities. The left ventricular internal cavity  size was normal in size. There is   no left ventricular hypertrophy. Left ventricular diastolic parameters  are consistent with Grade I diastolic dysfunction (impaired relaxation).   Right Ventricle: The right ventricular size is normal. No increase in  right ventricular wall thickness. Right ventricular systolic function is  normal.   Left Atrium: Left atrial size was normal in size.   Right Atrium: Right atrial size was normal in size.   Pericardium: Trivial pericardial effusion is present. The pericardial effusion is posterior to the left ventricle.   Mitral Valve: The mitral valve is abnormal. There is mild thickening of the mitral valve leaflet(s). There is mild calcification  of the mitral valve leaflet(s). Mild mitral valve regurgitation. No evidence of mitral valve stenosis.   Tricuspid Valve: The tricuspid valve is normal in structure. Tricuspid valve regurgitation is mild . No evidence of tricuspid stenosis.   Aortic Valve: The aortic valve is calcified. There is moderate calcification of the aortic valve. Aortic valve regurgitation is moderate. Aortic regurgitation PHT measures 927 msec. Mild to moderate aortic  stenosis is present. Aortic valve mean gradient  measures 10.3 mmHg. Aortic valve peak gradient measures 18.7 mmHg. Aortic  valve area, by VTI measures 1.10 cm.   Pulmonic Valve: The pulmonic valve was normal in structure. Pulmonic valve  regurgitation is trivial. No evidence of pulmonic stenosis.   Aorta: The aortic root is normal in size and structure.   Venous: The inferior vena cava is normal in size with greater than 50%  respiratory variability, suggesting right atrial pressure of 3 mmHg.   IAS/Shunts: No atrial level shunt detected by color flow Doppler.      Zio monitor  Patch Wear Time:  10 days and 9 hours (2023-01-05T10:25:46-0500 to  2023-01-15T20:15:44-0500)   Indication: Premature ventricular complexes.   Patient had a min HR of 55 bpm, max HR of 190 bpm, and avg HR of 78 bpm. Predominant underlying rhythm was Sinus Rhythm.    442 Supraventricular Tachycardia runs occurred, the run with the fastest interval lasting 4 beats with a max rate of 190 bpm, the  longest lasting 33.0 secs with an avg rate of 144 bpm. Supraventricular Tachycardia was detected within +/- 45 seconds of symptomatic patient event(s). Isolated SVEs were occasional (1.3%, 14444), SVE Couplets were rare (<1.0%, 1342), and SVE Triplets  were rare (<1.0%, 405). Isolated VEs were rare (<1.0%), and no VE Couplets or VE Triplets were present.   Symptoms associated with supraventricular tachycardia and premature ventricular complex.  Conclusion: This study is remarkable for the following:                                  1.  Symptomatic paroxysmal supraventricular tachycardia.                                   2.  Symptomatic occasional premature atrial complexes.    Recent Labs: 12/12/2022: BUN 26; Creatinine, Ser 1.06; Hemoglobin 13.8; Platelets 223; Potassium 4.3; Sodium 141; TSH 0.471  Recent Lipid Panel No results found for: "CHOL", "TRIG", "HDL", "CHOLHDL", "VLDL", "LDLCALC", "LDLDIRECT"  Physical Exam:    VS:  BP (!) 210/160 (BP Location: Right Arm, Patient Position: Sitting, Cuff Size: Normal)   Pulse 60   Ht 5\' 3"  (1.6 m)   Wt 138 lb 9.6 oz (62.9 kg)   SpO2 97%   BMI 24.55 kg/m     Wt Readings from Last 3 Encounters:  08/20/23 138 lb 9.6 oz (62.9 kg)  01/24/23 137 lb 9.6 oz (62.4 kg)  12/12/22 137 lb 12.8 oz (62.5 kg)     GEN: Well nourished, well developed in no acute distress HEENT: Normal NECK: No JVD; No carotid bruits LYMPHATICS: No lymphadenopathy CARDIAC: S1S2 noted,RRR, no murmurs, rubs, gallops RESPIRATORY:  Clear to auscultation without rales, wheezing or rhonchi  ABDOMEN: Soft, non-tender, non-distended, +bowel sounds,  no guarding. EXTREMITIES: No edema, No cyanosis, no clubbing MUSCULOSKELETAL:  No deformity  SKIN: Warm and dry NEUROLOGIC:  Alert and oriented x 3, non-focal  PSYCHIATRIC:  Normal affect, good insight  ASSESSMENT:    1. Hypertension, unspecified type   2. PSVT (paroxysmal supraventricular tachycardia) (HCC)   3. PAC (premature atrial contraction)     PLAN:    Hypertension Elevated blood pressure readings at home, possibly influenced by Voltaren use for bursitis. Discussed the risks of uncontrolled hypertension and the need for medication adjustment. -Discontinue Metoprolol 12.5mg  at bedtime. -Start Carvedilol 6.25mg  twice daily. -Discontinue Voltaren and discuss alternative treatments with physical therapist. -Check blood pressure at home and bring cuff to next appointment for calibration.   Follow-up plan -Pharmacist visit in 2 weeks to check blood pressure and medication adjustment. -Return visit in 12 weeks with blood pressure cuff for calibration.  In terms of her valvular heart disease, she is asymptomatic we will continue to monitor the patient closely.  Repeat echo in 1 year.  The patient is in agreement with the above plan. The patient left the office in stable condition.  The patient will follow up in   Medication Adjustments/Labs and Tests Ordered: Current medicines are reviewed at length with the patient today.  Concerns regarding medicines are outlined above.  Orders Placed This Encounter  Procedures   AMB Referral to [facility]   EKG 12-Lead   Meds ordered this encounter  Medications   carvedilol (COREG) 6.25 MG tablet    Sig: Take 1 tablet (6.25 mg total) by mouth 2 (two) times daily.    Dispense:  180 tablet    Refill:  3    Patient Instructions  Medication Instructions:  Your physician has recommended you make the following change in your medication:  STOP: Metoprolol succinate (Toprol-XL) START: Carvedilol (Coreg) 6.25 mg twice daily *If  you need a refill on your cardiac medications before your next appointment, please call your pharmacy*   Lab Work: None  Testing/Procedures: None   Follow-Up: At [facility], you and your health needs are our priority.  As part of our continuing mission to provide you with exceptional heart care, we have created designated Provider Care Teams.  These Care Teams include your primary Cardiologist (physician) and Advanced Practice Providers (APPs -  Physician Assistants and Nurse Practitioners) who all work together to provide you with the care you need, when you need it.  Your next appointment:   12 week(s)  Provider:   Thomasene Ripple, DO     Other Instructions Please see out pharmacist in 2 weeks. Bring your blood pressure reading and cuff to that visit.     HOW TO TAKE YOUR BLOOD PRESSURE: Rest 5 minutes before taking your blood pressure. Don't smoke or drink caffeinated beverages for at least 30 minutes before. Take your blood pressure before (not after) you eat. Sit comfortably with your back supported and both feet on the floor (don't cross your legs). Elevate your arm to heart level on a table or a desk. Use the proper sized cuff. It should fit smoothly and snugly around your bare upper arm. There should be enough room to slip a fingertip under the cuff. The bottom edge of the cuff should be 1 inch above the crease of the elbow. Ideally, take 3 measurements at one sitting and record the average.    Adopting a Healthy Lifestyle.  Know what a healthy weight is for you (roughly BMI <25) and aim to maintain this   Aim for 7+ servings of fruits and vegetables daily   65-80+ fluid ounces of water or unsweet tea for healthy kidneys  Limit to max 1 drink of alcohol per day; avoid smoking/tobacco   Limit animal fats in diet for cholesterol and heart health - choose grass fed whenever available   Avoid highly processed foods, and foods high in saturated/trans fats    Aim for low stress - take time to unwind and care for your mental health   Aim for 150 min of moderate intensity exercise weekly for heart health, and weights twice weekly for bone health   Aim for 7-9 hours of sleep daily   When it comes to diets, agreement about the perfect plan isnt easy to find, even among the experts. Experts at the [facility] [employer] developed an idea known as the Healthy Eating Plate. Just imagine a plate divided into logical, healthy portions.   The emphasis is on diet quality:   Load up on vegetables and fruits - one-half of your plate: Aim for color and variety, and remember that potatoes dont count.   Go for whole grains - one-quarter of your plate: Whole wheat, barley, wheat berries, quinoa, oats, brown rice, and foods made with them. If you want pasta, go with whole wheat pasta.   Protein power - one-quarter of your plate: Fish, chicken, beans, and nuts are all healthy, versatile protein sources. Limit red meat.   The diet, however, does go beyond the plate, offering a few other suggestions.   Use healthy plant oils, such as olive, canola, soy, corn, sunflower and peanut. Check the labels, and avoid partially hydrogenated oil, which have unhealthy trans fats.   If youre thirsty, drink water. Coffee and tea are good in moderation, but skip sugary drinks and limit milk and dairy products to one or two daily servings.   The type of carbohydrate in the diet is more important than the amount. Some sources of carbohydrates, such as vegetables, fruits, whole grains, and beans-are healthier than others.   Finally, stay active  Signed, Monique Ripple, DO  08/20/2023 8:20 PM    [facility]

## 2023-08-20 NOTE — Patient Instructions (Addendum)
Medication Instructions:  Your physician has recommended you make the following change in your medication:  STOP: Metoprolol succinate (Toprol-XL) START: Carvedilol (Coreg) 6.25 mg twice daily *If you need a refill on your cardiac medications before your next appointment, please call your pharmacy*   Lab Work: None  Testing/Procedures: None   Follow-Up: At [facility], you and your health needs are our priority.  As part of our continuing mission to provide you with exceptional heart care, we have created designated Provider Care Teams.  These Care Teams include your primary Cardiologist (physician) and Advanced Practice Providers (APPs -  Physician Assistants and Nurse Practitioners) who all work together to provide you with the care you need, when you need it.  Your next appointment:   12 week(s)  Provider:   Thomasene Ripple, DO     Other Instructions Please see out pharmacist in 2 weeks. Bring your blood pressure reading and cuff to that visit.     HOW TO TAKE YOUR BLOOD PRESSURE: Rest 5 minutes before taking your blood pressure. Don't smoke or drink caffeinated beverages for at least 30 minutes before. Take your blood pressure before (not after) you eat. Sit comfortably with your back supported and both feet on the floor (don't cross your legs). Elevate your arm to heart level on a table or a desk. Use the proper sized cuff. It should fit smoothly and snugly around your bare upper arm. There should be enough room to slip a fingertip under the cuff. The bottom edge of the cuff should be 1 inch above the crease of the elbow. Ideally, take 3 measurements at one sitting and record the average.

## 2023-08-21 ENCOUNTER — Other Ambulatory Visit (HOSPITAL_BASED_OUTPATIENT_CLINIC_OR_DEPARTMENT_OTHER): Payer: Self-pay

## 2023-08-22 DIAGNOSIS — M79605 Pain in left leg: Secondary | ICD-10-CM | POA: Diagnosis not present

## 2023-08-22 DIAGNOSIS — M25651 Stiffness of right hip, not elsewhere classified: Secondary | ICD-10-CM | POA: Diagnosis not present

## 2023-08-22 DIAGNOSIS — M7062 Trochanteric bursitis, left hip: Secondary | ICD-10-CM | POA: Diagnosis not present

## 2023-08-22 DIAGNOSIS — M25652 Stiffness of left hip, not elsewhere classified: Secondary | ICD-10-CM | POA: Diagnosis not present

## 2023-08-25 DIAGNOSIS — M25651 Stiffness of right hip, not elsewhere classified: Secondary | ICD-10-CM | POA: Diagnosis not present

## 2023-08-25 DIAGNOSIS — M25652 Stiffness of left hip, not elsewhere classified: Secondary | ICD-10-CM | POA: Diagnosis not present

## 2023-08-25 DIAGNOSIS — M79605 Pain in left leg: Secondary | ICD-10-CM | POA: Diagnosis not present

## 2023-08-25 DIAGNOSIS — M7062 Trochanteric bursitis, left hip: Secondary | ICD-10-CM | POA: Diagnosis not present

## 2023-08-29 DIAGNOSIS — M25652 Stiffness of left hip, not elsewhere classified: Secondary | ICD-10-CM | POA: Diagnosis not present

## 2023-08-29 DIAGNOSIS — M7062 Trochanteric bursitis, left hip: Secondary | ICD-10-CM | POA: Diagnosis not present

## 2023-08-29 DIAGNOSIS — M25651 Stiffness of right hip, not elsewhere classified: Secondary | ICD-10-CM | POA: Diagnosis not present

## 2023-08-29 DIAGNOSIS — M79605 Pain in left leg: Secondary | ICD-10-CM | POA: Diagnosis not present

## 2023-09-01 DIAGNOSIS — M25651 Stiffness of right hip, not elsewhere classified: Secondary | ICD-10-CM | POA: Diagnosis not present

## 2023-09-01 DIAGNOSIS — M25652 Stiffness of left hip, not elsewhere classified: Secondary | ICD-10-CM | POA: Diagnosis not present

## 2023-09-01 DIAGNOSIS — M7062 Trochanteric bursitis, left hip: Secondary | ICD-10-CM | POA: Diagnosis not present

## 2023-09-01 DIAGNOSIS — M79605 Pain in left leg: Secondary | ICD-10-CM | POA: Diagnosis not present

## 2023-09-05 DIAGNOSIS — M7062 Trochanteric bursitis, left hip: Secondary | ICD-10-CM | POA: Diagnosis not present

## 2023-09-05 DIAGNOSIS — M25652 Stiffness of left hip, not elsewhere classified: Secondary | ICD-10-CM | POA: Diagnosis not present

## 2023-09-05 DIAGNOSIS — M79605 Pain in left leg: Secondary | ICD-10-CM | POA: Diagnosis not present

## 2023-09-05 DIAGNOSIS — M25651 Stiffness of right hip, not elsewhere classified: Secondary | ICD-10-CM | POA: Diagnosis not present

## 2023-09-08 DIAGNOSIS — M25652 Stiffness of left hip, not elsewhere classified: Secondary | ICD-10-CM | POA: Diagnosis not present

## 2023-09-08 DIAGNOSIS — M7062 Trochanteric bursitis, left hip: Secondary | ICD-10-CM | POA: Diagnosis not present

## 2023-09-08 DIAGNOSIS — M79605 Pain in left leg: Secondary | ICD-10-CM | POA: Diagnosis not present

## 2023-09-08 DIAGNOSIS — M25651 Stiffness of right hip, not elsewhere classified: Secondary | ICD-10-CM | POA: Diagnosis not present

## 2023-09-12 ENCOUNTER — Ambulatory Visit: Payer: Medicare PPO | Attending: Cardiovascular Disease | Admitting: Pharmacist

## 2023-09-12 VITALS — BP 172/80 | HR 81

## 2023-09-12 DIAGNOSIS — M79605 Pain in left leg: Secondary | ICD-10-CM | POA: Diagnosis not present

## 2023-09-12 DIAGNOSIS — I10 Essential (primary) hypertension: Secondary | ICD-10-CM

## 2023-09-12 DIAGNOSIS — M7062 Trochanteric bursitis, left hip: Secondary | ICD-10-CM | POA: Diagnosis not present

## 2023-09-12 DIAGNOSIS — M25651 Stiffness of right hip, not elsewhere classified: Secondary | ICD-10-CM | POA: Diagnosis not present

## 2023-09-12 DIAGNOSIS — M25652 Stiffness of left hip, not elsewhere classified: Secondary | ICD-10-CM | POA: Diagnosis not present

## 2023-09-12 DIAGNOSIS — D692 Other nonthrombocytopenic purpura: Secondary | ICD-10-CM | POA: Insufficient documentation

## 2023-09-12 NOTE — Patient Instructions (Addendum)
It was nice meeting you today  Your blood pressure readings look much better at home than in the office which is reassuring  Please continue your: Valsartan/hydrochlorothiazide 320/25 once a day in the morning Carvedilol 6.25mg  twice a day  Please continue to check your blood pressure at home a few times a week and let us know if it begins to increase  Have a happy Thanksgiving!  Laural Golden, PharmD, BCACP, CDCES, CPP [address] [place], [place], [address] Phone: [phone], Fax: [phone]

## 2023-09-12 NOTE — Progress Notes (Unsigned)
Patient ID: Monique Wright                 DOB: 03/28/1936                      MRN: [id number]     HPI: Monique Wright is a 87 y.o. female referred by Dr. Servando Salina to HTN clinic. PMH is significant for aortic regurgitation, HTN, PVCs, and PSVT.   Patient presents today to discuss HTN management. Has been monitoring BP at home which has been better controlled in office. Patient believes she may have white coat HTN.  Home readings: 11/22: 144/88 11/21: 141/89 11/20: 146/82 11/19: 140/78 11/18: 134/74  Brought home cuff to appointment. Currently managed on carvedilol 6.25mg  BiD and valsartan/hydrochlorothiazide 320/25 without patient reported adverse effects.  Resides in retirement facility.  Has history of arthritis, achilles tendon contracture, osteopenia, scoliosis, and tibial tendonitis.  Current HTN meds:  Carvedilol 6.25mg  BID Valsartan hydrochlorothiazide 320-25mg  daily  Wt Readings from Last 3 Encounters:  08/20/23 138 lb 9.6 oz (62.9 kg)  01/24/23 137 lb 9.6 oz (62.4 kg)  12/12/22 137 lb 12.8 oz (62.5 kg)   BP Readings from Last 3 Encounters:  08/20/23 (!) 210/160  01/24/23 (!) 160/96  12/14/22 (!) 146/88   Pulse Readings from Last 3 Encounters:  08/20/23 60  01/24/23 67  12/12/22 78    Renal function: CrCl cannot be calculated (Patient's most recent lab result is older than the maximum 21 days allowed.).  Past Medical History:  Diagnosis Date   Anemia    Arthritis    spinal arthritis   Coronary atherosclerosis of native coronary artery    pt denies having any heart problems and never seen the person who entered this in.   Fatigue    Hypertension    Hypothyroidism    Left ventricular systolic dysfunction    Plantar fasciitis    Scoliosis    Shortness of breath    on exertion    Current Outpatient Medications on File Prior to Visit  Medication Sig Dispense Refill   carvedilol (COREG) 6.25 MG tablet Take 1 tablet (6.25 mg total) by mouth 2 (two)  times daily. 180 tablet 3   Cholecalciferol (VITAMIN D3) 50 MCG (2000 UT) CAPS Take 50 mcg by mouth daily.     levothyroxine (SYNTHROID, LEVOTHROID) 50 MCG tablet Take 1 tablet by mouth daily before breakfast.      LUMIGAN 0.01 % SOLN 1 drop at bedtime.     omeprazole (PRILOSEC) 40 MG capsule TAKE 1 CAPSULE BY MOUTH TWICE A DAY 60 capsule 6   rosuvastatin (CRESTOR) 10 MG tablet Take 10 mg by mouth daily.     traMADol (ULTRAM) 50 MG tablet Take 1 tablet (50 mg total) by mouth 3 (three) times daily. (Patient not taking: Reported on 08/20/2023) 90 tablet 1   traMADol (ULTRAM) 50 MG tablet Take 1 tablet (50 mg total) by mouth 3 (three) times daily. 90 tablet 1   traZODone (DESYREL) 100 MG tablet Take 100 mg by mouth at bedtime.      valsartan-hydrochlorothiazide (DIOVAN-HCT) 320-25 MG per tablet Take 1 tablet by mouth every morning.      No current facility-administered medications on file prior to visit.    Allergies  Allergen Reactions   Penicillins Hives   Pneumococcal Vac Polyvalent Other (See Comments)   Pneumococcal Vaccines Swelling   Prevnar 13 [Pneumococcal 13-Val Conj Vacc] Other (See Comments)    Assessment/Plan:  1.  Hypertension -  Patient BP in room 172/80 which remains above goal although home readings ~30 points lower. Home cuff appears accurate. While BP still elevated, hesitant to add on additional agents since patient is a fall risk and she feels well on current regimen. No med changes at this time and patient will follow up with Dr Servando Salina in January,  Continue carvedilol 6.25mg  BID Continue valsartan/hydrochlorothiazide 320/25 daily Continue to monitor BP at home Follow up with Dr Servando Salina in 8 weeks  Laural Golden, PharmD, BCACP, CDCES, CPP [address], [address] [place], [place], [address] Phone: [phone], Fax: [phone]

## 2023-09-14 ENCOUNTER — Encounter: Payer: Self-pay | Admitting: Pharmacist

## 2023-09-22 DIAGNOSIS — M419 Scoliosis, unspecified: Secondary | ICD-10-CM | POA: Diagnosis not present

## 2023-09-22 DIAGNOSIS — I471 Supraventricular tachycardia, unspecified: Secondary | ICD-10-CM | POA: Diagnosis not present

## 2023-09-22 DIAGNOSIS — E785 Hyperlipidemia, unspecified: Secondary | ICD-10-CM | POA: Diagnosis not present

## 2023-09-22 DIAGNOSIS — I10 Essential (primary) hypertension: Secondary | ICD-10-CM | POA: Diagnosis not present

## 2023-09-22 DIAGNOSIS — E039 Hypothyroidism, unspecified: Secondary | ICD-10-CM | POA: Diagnosis not present

## 2023-09-22 DIAGNOSIS — F3289 Other specified depressive episodes: Secondary | ICD-10-CM | POA: Diagnosis not present

## 2023-09-22 DIAGNOSIS — I351 Nonrheumatic aortic (valve) insufficiency: Secondary | ICD-10-CM | POA: Diagnosis not present

## 2023-09-22 DIAGNOSIS — I493 Ventricular premature depolarization: Secondary | ICD-10-CM | POA: Diagnosis not present

## 2023-09-26 DIAGNOSIS — M25652 Stiffness of left hip, not elsewhere classified: Secondary | ICD-10-CM | POA: Diagnosis not present

## 2023-09-26 DIAGNOSIS — M7062 Trochanteric bursitis, left hip: Secondary | ICD-10-CM | POA: Diagnosis not present

## 2023-09-26 DIAGNOSIS — M79605 Pain in left leg: Secondary | ICD-10-CM | POA: Diagnosis not present

## 2023-09-26 DIAGNOSIS — M25651 Stiffness of right hip, not elsewhere classified: Secondary | ICD-10-CM | POA: Diagnosis not present

## 2023-10-01 ENCOUNTER — Other Ambulatory Visit (HOSPITAL_BASED_OUTPATIENT_CLINIC_OR_DEPARTMENT_OTHER): Payer: Self-pay

## 2023-10-01 DIAGNOSIS — L0101 Non-bullous impetigo: Secondary | ICD-10-CM | POA: Diagnosis not present

## 2023-10-01 DIAGNOSIS — L0889 Other specified local infections of the skin and subcutaneous tissue: Secondary | ICD-10-CM | POA: Diagnosis not present

## 2023-10-01 MED ORDER — MUPIROCIN 2 % EX OINT
1.0000 | TOPICAL_OINTMENT | Freq: Two times a day (BID) | CUTANEOUS | 0 refills | Status: AC
  Filled 2023-10-01: qty 22, 11d supply, fill #0

## 2023-10-16 ENCOUNTER — Other Ambulatory Visit (HOSPITAL_BASED_OUTPATIENT_CLINIC_OR_DEPARTMENT_OTHER): Payer: Self-pay

## 2023-10-16 MED ORDER — TRAMADOL HCL 50 MG PO TABS
50.0000 mg | ORAL_TABLET | Freq: Three times a day (TID) | ORAL | 0 refills | Status: DC
  Filled 2023-10-16: qty 90, 30d supply, fill #0

## 2023-11-07 DIAGNOSIS — H401131 Primary open-angle glaucoma, bilateral, mild stage: Secondary | ICD-10-CM | POA: Diagnosis not present

## 2023-11-07 DIAGNOSIS — H04123 Dry eye syndrome of bilateral lacrimal glands: Secondary | ICD-10-CM | POA: Diagnosis not present

## 2023-11-13 ENCOUNTER — Ambulatory Visit: Payer: Medicare PPO | Admitting: Cardiology

## 2023-11-25 ENCOUNTER — Other Ambulatory Visit: Payer: Self-pay | Admitting: Internal Medicine

## 2023-11-25 DIAGNOSIS — Z1231 Encounter for screening mammogram for malignant neoplasm of breast: Secondary | ICD-10-CM

## 2023-12-17 ENCOUNTER — Encounter: Payer: Self-pay | Admitting: Cardiology

## 2023-12-17 ENCOUNTER — Ambulatory Visit: Payer: Medicare PPO | Attending: Cardiology | Admitting: Cardiology

## 2023-12-17 VITALS — BP 150/80 | HR 66 | Ht 63.0 in | Wt 139.6 lb

## 2023-12-17 DIAGNOSIS — E782 Mixed hyperlipidemia: Secondary | ICD-10-CM | POA: Diagnosis not present

## 2023-12-17 DIAGNOSIS — I10 Essential (primary) hypertension: Secondary | ICD-10-CM | POA: Diagnosis not present

## 2023-12-17 NOTE — Patient Instructions (Signed)
 Medication Instructions:  Your physician recommends that you continue on your current medications as directed. Please refer to the Current Medication list given to you today.  *If you need a refill on your cardiac medications before your next appointment, please call your pharmacy*   Follow-Up: At [facility], you and your health needs are our priority.  As part of our continuing mission to provide you with exceptional heart care, we have created designated Provider Care Teams.  These Care Teams include your primary Cardiologist (physician) and Advanced Practice Providers (APPs -  Physician Assistants and Nurse Practitioners) who all work together to provide you with the care you need, when you need it.  Your next appointment:   9 month(s)  Provider:   Thomasene Ripple, DO

## 2023-12-20 NOTE — Progress Notes (Signed)
 Cardiology Office Note:    Date:  12/20/2023   ID:  Monique Wright, DOB [birth year], MRN [id number]  PCP:  Geoffry Paradise, MD  Cardiologist:  Thomasene Ripple, DO  Electrophysiologist:  None   Referring MD: Geoffry Paradise, MD   " I am doing well"  History of Present Illness:    Monique Wright is a 88 y.o. female with a hx of moderate aortic regurgitation, aortic valve stenosis, mitral regurgitation, paroxysmal SVT, PACs here today for follow-up visit.   She presents for a routine follow-up. They report feeling well and have been checking their blood pressure at home, which was 133/76 this morning. They have been adhering to their prescribed medications and do not currently need any refills. They had blood work done in June, which was reportedly normal.  The patient also mentions having scoliosis, which has been causing discomfort, particularly in cold weather. They have been attending physical therapy and have been advised to avoid heavy lifting and certain movements. They have been doing some exercises at the gym as recommended by their physical therapist.  No chest pain or shortness of breath.  Past Medical History:  Diagnosis Date   Anemia    Arthritis    spinal arthritis   Coronary atherosclerosis of native coronary artery    pt denies having any heart problems and never seen the person who entered this in.   Fatigue    Hypertension    Hypothyroidism    Left ventricular systolic dysfunction    Plantar fasciitis    Scoliosis    Shortness of breath    on exertion    Past Surgical History:  Procedure Laterality Date   ABDOMINAL HYSTERECTOMY     tah and bso   APPENDECTOMY     ENTEROSCOPY N/A 12/21/2013   Procedure: ENTEROSCOPY;  Surgeon: Hilarie Fredrickson, MD;  Location: [facility];  Service: Endoscopy;  Laterality: N/A;   TONSILECTOMY, ADENOIDECTOMY, BILATERAL MYRINGOTOMY AND TUBES     torn cartlidge repair Right yrs ago    Current Medications: Current Meds   Medication Sig   carvedilol (COREG) 6.25 MG tablet Take 1 tablet (6.25 mg total) by mouth 2 (two) times daily.   Cholecalciferol (VITAMIN D3) 50 MCG (2000 UT) CAPS Take 50 mcg by mouth daily.   levothyroxine (SYNTHROID, LEVOTHROID) 50 MCG tablet Take 1 tablet by mouth daily before breakfast.    LUMIGAN 0.01 % SOLN 1 drop at bedtime.   omeprazole (PRILOSEC) 40 MG capsule TAKE 1 CAPSULE BY MOUTH TWICE A DAY   rosuvastatin (CRESTOR) 10 MG tablet Take 10 mg by mouth daily.   traMADol (ULTRAM) 50 MG tablet Take 1 tablet (50 mg total) by mouth 3 (three) times daily.   traMADol (ULTRAM) 50 MG tablet Take 1 tablet (50 mg total) by mouth 3 (three) times daily.   traZODone (DESYREL) 100 MG tablet Take 100 mg by mouth at bedtime.    valsartan-hydrochlorothiazide (DIOVAN-HCT) 320-25 MG per tablet Take 1 tablet by mouth every morning.      Allergies:   Penicillins, Pneumococcal vac polyvalent, Pneumococcal vaccines, and Prevnar 13 [pneumococcal 13-val conj vacc]   Social History   Socioeconomic History   Marital status: Married    Spouse name: Not on file   Number of children: Not on file   Years of education: Not on file   Highest education level: Not on file  Occupational History   Occupation: retired Teacher, adult education: [employer]  Tobacco Use  Smoking status: Never   Smokeless tobacco: Never  Vaping Use   Vaping status: Never Used  Substance and Sexual Activity   Alcohol use: Yes    Comment: occasional   Drug use: No   Sexual activity: Never    Partners: Male  Other Topics Concern   Not on file  Social History Narrative   Not on file   Social Drivers of Health   Financial Resource Strain: Not on file  Food Insecurity: Not on file  Transportation Needs: Not on file  Physical Activity: Not on file  Stress: Not on file  Social Connections: Not on file     Family History: The patient's family history includes Heart attack in her mother; Lung cancer in her father.  There is no history of Stomach cancer, Colon cancer, or Breast cancer.  ROS:   Review of Systems  Constitution: Negative for decreased appetite, fever and weight gain.  HENT: Negative for congestion, ear discharge, hoarse voice and sore throat.   Eyes: Negative for discharge, redness, vision loss in right eye and visual halos.  Cardiovascular: Negative for chest pain, dyspnea on exertion, leg swelling, orthopnea and palpitations.  Respiratory: Negative for cough, hemoptysis, shortness of breath and snoring.   Endocrine: Negative for heat intolerance and polyphagia.  Hematologic/Lymphatic: Negative for bleeding problem. Does not bruise/bleed easily.  Skin: Negative for flushing, nail changes, rash and suspicious lesions.  Musculoskeletal: Negative for arthritis, joint pain, muscle cramps, myalgias, neck pain and stiffness.  Gastrointestinal: Negative for abdominal pain, bowel incontinence, diarrhea and excessive appetite.  Genitourinary: Negative for decreased libido, genital sores and incomplete emptying.  Neurological: Negative for brief paralysis, focal weakness, headaches and loss of balance.  Psychiatric/Behavioral: Negative for altered mental status, depression and suicidal ideas.  Allergic/Immunologic: Negative for HIV exposure and persistent infections.    EKGs/Labs/Other Studies Reviewed:    The following studies were reviewed today:   EKG: None today  Tte 01/13/2023 IMPRESSIONS     1. Left ventricular ejection fraction, by estimation, is 60 to 65%. Left  ventricular ejection fraction by 3D volume is 60 %. The left ventricle has  normal function. The left ventricle has no regional wall motion  abnormalities. There is mild left  ventricular hypertrophy. Left ventricular diastolic parameters are  consistent with Grade I diastolic dysfunction (impaired relaxation). The  average left ventricular global longitudinal strain is -19.2 %.   2. Right ventricular systolic function  is normal. The right ventricular  size is normal. There is normal pulmonary artery systolic pressure. The  estimated right ventricular systolic pressure is 21.8 mmHg.   3. The mitral valve is normal in structure. Trivial mitral valve  regurgitation. No evidence of mitral stenosis.   4. The aortic valve is calcified. There is moderate calcification of the  aortic valve. Aortic valve regurgitation is moderate. Moderate aortic  valve stenosis. Vmax 2.9 m/s, MG , AVA 1.1 cm^2, DI 0.32   5. Aortic dilatation noted. There is mild dilatation of the ascending  aorta, measuring 39 mm.   6. The inferior vena cava is normal in size with greater than 50%  respiratory variability, suggesting right atrial pressure of 3 mmHg.   FINDINGS   Left Ventricle: Left ventricular ejection fraction, by estimation, is 60  to 65%. Left ventricular ejection fraction by 3D volume is 60 %. The left  ventricle has normal function. The left ventricle has no regional wall  motion abnormalities. The average  left ventricular global longitudinal strain is -19.2 %.  The left  ventricular internal cavity size was small. There is mild left ventricular  hypertrophy. Left ventricular diastolic parameters are consistent with  Grade I diastolic dysfunction (impaired  relaxation).   Right Ventricle: The right ventricular size is normal. No increase in  right ventricular wall thickness. Right ventricular systolic function is  normal. There is normal pulmonary artery systolic pressure. The tricuspid  regurgitant velocity is 2.17 m/s, and   with an assumed right atrial pressure of 3 mmHg, the estimated right  ventricular systolic pressure is 21.8 mmHg.   Left Atrium: Left atrial size was normal in size.   Right Atrium: Right atrial size was normal in size.   Pericardium: There is no evidence of pericardial effusion.   Mitral Valve: The mitral valve is normal in structure. Trivial mitral  valve regurgitation. No  evidence of mitral valve stenosis.   Tricuspid Valve: The tricuspid valve is normal in structure. Tricuspid  valve regurgitation is mild.   Aortic Valve: The aortic valve is calcified. There is moderate  calcification of the aortic valve. Aortic valve regurgitation is moderate.  Aortic regurgitation PHT measures 216 msec. Moderate aortic stenosis is  present. Aortic valve mean gradient measures   19.0 mmHg. Aortic valve peak gradient measures 33.6 mmHg. Aortic valve  area, by VTI measures 1.14 cm.   Pulmonic Valve: The pulmonic valve was not well visualized. Pulmonic valve  regurgitation is trivial.   Aorta: The aortic root is normal in size and structure and aortic  dilatation noted. There is mild dilatation of the ascending aorta,  measuring 39 mm.   Venous: The inferior vena cava is normal in size with greater than 50%  respiratory variability, suggesting right atrial pressure of 3 mmHg.   IAS/Shunts: The interatrial septum was not well visualized.       TTE 11/09/2021 IMPRESSIONS   1. Left ventricular ejection fraction, by estimation, is 60 to 65%. The  left ventricle has normal function. The left ventricle has no regional  wall motion abnormalities. Left ventricular diastolic parameters are  consistent with Grade I diastolic  dysfunction (impaired relaxation).   2. Right ventricular systolic function is normal. The right ventricular  size is normal.   3. The pericardial effusion is posterior to the left ventricle.   4. The mitral valve is abnormal. Mild mitral valve regurgitation. No  evidence of mitral stenosis.   5. The aortic valve is calcified. There is moderate calcification of the  aortic valve. Aortic valve regurgitation is moderate. Mild to moderate  aortic valve stenosis.   6. The inferior vena cava is normal in size with greater than 50%  respiratory variability, suggesting right atrial pressure of 3 mmHg.   FINDINGS   Left Ventricle: Left ventricular  ejection fraction, by estimation, is 60  to 65%. The left ventricle has normal function. The left ventricle has no  regional wall motion abnormalities. The left ventricular internal cavity  size was normal in size. There is   no left ventricular hypertrophy. Left ventricular diastolic parameters  are consistent with Grade I diastolic dysfunction (impaired relaxation).   Right Ventricle: The right ventricular size is normal. No increase in  right ventricular wall thickness. Right ventricular systolic function is  normal.   Left Atrium: Left atrial size was normal in size.   Right Atrium: Right atrial size was normal in size.   Pericardium: Trivial pericardial effusion is present. The pericardial effusion is posterior to the left ventricle.   Mitral Valve: The mitral valve  is abnormal. There is mild thickening of the mitral valve leaflet(s). There is mild calcification of the mitral valve leaflet(s). Mild mitral valve regurgitation. No evidence of mitral valve stenosis.   Tricuspid Valve: The tricuspid valve is normal in structure. Tricuspid valve regurgitation is mild . No evidence of tricuspid stenosis.   Aortic Valve: The aortic valve is calcified. There is moderate calcification of the aortic valve. Aortic valve regurgitation is moderate. Aortic regurgitation PHT measures 927 msec. Mild to moderate aortic  stenosis is present. Aortic valve mean gradient  measures 10.3 mmHg. Aortic valve peak gradient measures 18.7 mmHg. Aortic  valve area, by VTI measures 1.10 cm.   Pulmonic Valve: The pulmonic valve was normal in structure. Pulmonic valve  regurgitation is trivial. No evidence of pulmonic stenosis.   Aorta: The aortic root is normal in size and structure.   Venous: The inferior vena cava is normal in size with greater than 50%  respiratory variability, suggesting right atrial pressure of 3 mmHg.   IAS/Shunts: No atrial level shunt detected by color flow Doppler.      Zio  monitor  Patch Wear Time:  10 days and 9 hours (2023-01-05T10:25:46-0500 to 2023-01-15T20:15:44-0500)   Indication: Premature ventricular complexes.   Patient had a min HR of 55 bpm, max HR of 190 bpm, and avg HR of 78 bpm. Predominant underlying rhythm was Sinus Rhythm.    442 Supraventricular Tachycardia runs occurred, the run with the fastest interval lasting 4 beats with a max rate of 190 bpm, the  longest lasting 33.0 secs with an avg rate of 144 bpm. Supraventricular Tachycardia was detected within +/- 45 seconds of symptomatic patient event(s). Isolated SVEs were occasional (1.3%, 14444), SVE Couplets were rare (<1.0%, 1342), and SVE Triplets  were rare (<1.0%, 405). Isolated VEs were rare (<1.0%), and no VE Couplets or VE Triplets were present.   Symptoms associated with supraventricular tachycardia and premature ventricular complex.  Conclusion: This study is remarkable for the following:                                  1.  Symptomatic paroxysmal supraventricular tachycardia.                                   2.  Symptomatic occasional premature atrial complexes.    Recent Labs: No results found for requested labs within last 365 days.  Recent Lipid Panel No results found for: "CHOL", "TRIG", "HDL", "CHOLHDL", "VLDL", "LDLCALC", "LDLDIRECT"  Physical Exam:    VS:  BP (!) 150/80 (BP Location: Right Arm, Patient Position: Sitting, Cuff Size: Normal)   Pulse 66   Ht 5\' 3"  (1.6 m)   Wt 139 lb 9.6 oz (63.3 kg)   SpO2 96%   BMI 24.73 kg/m     Wt Readings from Last 3 Encounters:  12/17/23 139 lb 9.6 oz (63.3 kg)  08/20/23 138 lb 9.6 oz (62.9 kg)  01/24/23 137 lb 9.6 oz (62.4 kg)     GEN: Well nourished, well developed in no acute distress HEENT: Normal NECK: No JVD; No carotid bruits LYMPHATICS: No lymphadenopathy CARDIAC: S1S2 noted,RRR, no murmurs, rubs, gallops RESPIRATORY:  Clear to auscultation without rales, wheezing or rhonchi  ABDOMEN: Soft, non-tender,  non-distended, +bowel sounds, no guarding. EXTREMITIES: No edema, No cyanosis, no clubbing MUSCULOSKELETAL:  No deformity  SKIN: Warm and  dry NEUROLOGIC:  Alert and oriented x 3, non-focal PSYCHIATRIC:  Normal affect, good insight  ASSESSMENT:    1. Essential hypertension   2. Mixed hyperlipidemia    PLAN:    Hypertension Well controlled with home monitoring. Last reading 133/76. -Continue current antihypertensive regimen. -Continue home blood pressure monitoring.  Hyperlipidemia Last blood work in June was satisfactory. Patient is due for follow-up blood work in June. -Continue current lipid-lowering medication. -Check lipid panel in June.   General Health Maintenance / Followup Plans -Schedule follow-up appointment in 9 months.   The patient is in agreement with the above plan. The patient left the office in stable condition.  The patient will follow up in   Medication Adjustments/Labs and Tests Ordered: Current medicines are reviewed at length with the patient today.  Concerns regarding medicines are outlined above.  No orders of the defined types were placed in this encounter.  No orders of the defined types were placed in this encounter.   Patient Instructions  Medication Instructions:  Your physician recommends that you continue on your current medications as directed. Please refer to the Current Medication list given to you today.  *If you need a refill on your cardiac medications before your next appointment, please call your pharmacy*   Follow-Up: At [facility], you and your health needs are our priority.  As part of our continuing mission to provide you with exceptional heart care, we have created designated Provider Care Teams.  These Care Teams include your primary Cardiologist (physician) and Advanced Practice Providers (APPs -  Physician Assistants and Nurse Practitioners) who all work together to provide you with the care you need, when you need  it.  Your next appointment:   9 month(s)  Provider:   Thomasene Ripple, DO       Adopting a Healthy Lifestyle.  Know what a healthy weight is for you (roughly BMI <25) and aim to maintain this   Aim for 7+ servings of fruits and vegetables daily   65-80+ fluid ounces of water or unsweet tea for healthy kidneys   Limit to max 1 drink of alcohol per day; avoid smoking/tobacco   Limit animal fats in diet for cholesterol and heart health - choose grass fed whenever available   Avoid highly processed foods, and foods high in saturated/trans fats   Aim for low stress - take time to unwind and care for your mental health   Aim for 150 min of moderate intensity exercise weekly for heart health, and weights twice weekly for bone health   Aim for 7-9 hours of sleep daily   When it comes to diets, agreement about the perfect plan isnt easy to find, even among the experts. Experts at the [facility] [employer] developed an idea known as the Healthy Eating Plate. Just imagine a plate divided into logical, healthy portions.   The emphasis is on diet quality:   Load up on vegetables and fruits - one-half of your plate: Aim for color and variety, and remember that potatoes dont count.   Go for whole grains - one-quarter of your plate: Whole wheat, barley, wheat berries, quinoa, oats, brown rice, and foods made with them. If you want pasta, go with whole wheat pasta.   Protein power - one-quarter of your plate: Fish, chicken, beans, and nuts are all healthy, versatile protein sources. Limit red meat.   The diet, however, does go beyond the plate, offering a few other suggestions.   Use  healthy plant oils, such as olive, canola, soy, corn, sunflower and peanut. Check the labels, and avoid partially hydrogenated oil, which have unhealthy trans fats.   If youre thirsty, drink water. Coffee and tea are good in moderation, but skip sugary drinks and limit milk and dairy products to one  or two daily servings.   The type of carbohydrate in the diet is more important than the amount. Some sources of carbohydrates, such as vegetables, fruits, whole grains, and beans-are healthier than others.   Finally, stay active  Signed, Thomasene Ripple, DO  12/20/2023 11:41 AM    [facility]

## 2023-12-25 ENCOUNTER — Other Ambulatory Visit (HOSPITAL_BASED_OUTPATIENT_CLINIC_OR_DEPARTMENT_OTHER): Payer: Self-pay

## 2023-12-29 ENCOUNTER — Other Ambulatory Visit (HOSPITAL_BASED_OUTPATIENT_CLINIC_OR_DEPARTMENT_OTHER): Payer: Self-pay

## 2023-12-29 DIAGNOSIS — M47816 Spondylosis without myelopathy or radiculopathy, lumbar region: Secondary | ICD-10-CM | POA: Diagnosis not present

## 2023-12-29 MED ORDER — METHYLPREDNISOLONE 4 MG PO TBPK
ORAL_TABLET | ORAL | 0 refills | Status: DC
  Filled 2023-12-29: qty 21, 6d supply, fill #0

## 2023-12-29 MED ORDER — TRAMADOL HCL 50 MG PO TABS
25.0000 mg | ORAL_TABLET | Freq: Two times a day (BID) | ORAL | 0 refills | Status: DC
  Filled 2023-12-29: qty 30, 30d supply, fill #0
  Filled 2024-01-28: qty 30, 30d supply, fill #1

## 2024-01-05 ENCOUNTER — Ambulatory Visit
Admission: RE | Admit: 2024-01-05 | Discharge: 2024-01-05 | Disposition: A | Discharge status: Home / Self Care | Payer: Medicare PPO | Source: Ambulatory Visit | Attending: Internal Medicine | Admitting: Internal Medicine

## 2024-01-05 DIAGNOSIS — Z1231 Encounter for screening mammogram for malignant neoplasm of breast: Secondary | ICD-10-CM

## 2024-01-28 ENCOUNTER — Other Ambulatory Visit: Payer: Self-pay

## 2024-02-02 ENCOUNTER — Encounter: Payer: Self-pay | Admitting: Podiatry

## 2024-02-02 ENCOUNTER — Ambulatory Visit: Admitting: Podiatry

## 2024-02-02 VITALS — Ht 63.0 in | Wt 139.0 lb

## 2024-02-02 DIAGNOSIS — M2011 Hallux valgus (acquired), right foot: Secondary | ICD-10-CM | POA: Diagnosis not present

## 2024-02-02 DIAGNOSIS — L84 Corns and callosities: Secondary | ICD-10-CM | POA: Diagnosis not present

## 2024-02-02 NOTE — Progress Notes (Unsigned)
      Chief Complaint  Patient presents with   Foot Pain    " She  has been having a lot of throbbing pain and it can wake her up at night and the bunion does not bother her"   HPI: 88 y.o. female presents today with concern of pain and "something hard" between the 1st and 2nd toes on the right foot.  Patient denies injury.  She states that the area has throb on occasion.  Denies wearing any shoes that were too tight recently.  She is aware that she has a bunion and states that the big toe has been drifting over more towards the second toe.  Past Medical History:  Diagnosis Date   Anemia    Arthritis    spinal arthritis   Coronary atherosclerosis of native coronary artery    pt denies having any heart problems and never seen the person who entered this in.   Fatigue    Hypertension    Hypothyroidism    Left ventricular systolic dysfunction    Plantar fasciitis    Scoliosis    Shortness of breath    on exertion    Past Surgical History:  Procedure Laterality Date   ABDOMINAL HYSTERECTOMY     tah and bso   APPENDECTOMY     ENTEROSCOPY N/A 12/21/2013   Procedure: ENTEROSCOPY;  Surgeon: Tobin Forts, MD;  Location: [facility];  Service: Endoscopy;  Laterality: N/A;   TONSILECTOMY, ADENOIDECTOMY, BILATERAL MYRINGOTOMY AND TUBES     torn cartlidge repair Right yrs ago    Allergies  Allergen Reactions   Penicillins Hives   Pneumococcal Vac Polyvalent Other (See Comments)   Pneumococcal Vaccines Swelling   Prevnar 13 [Pneumococcal 13-Val Conj Vacc] Other (See Comments)    Physical Exam: Palpable pedal pulses noted.  There is a hyperkeratotic lesion with pain on palpation on the medial aspect of the right second toe at the DIPJ level.  No ulceration is noted.  No surrounding erythema or edema.  There is a bony prominence on the medial aspect of the right first MPJ with lateral deviation of the hallux.  This is manually reducible.  Epicritic sensation is intact.  Assessment/Plan  of Care: 1. Corns   2. Hallux valgus, right     Discussed clinical findings with patient today.  The hyperkeratotic lesion was softened with 3-WEA solution and then debrided with a sterile #313 blade.  Several different toe spacers were fitted and dispensed to keep the hallux away from the second toe.  Informed the patient that this lesion may recur due to pressure between the 1st and 2nd toes.  The patient noted immediate improvement at the end of the appointment.  Follow up as needed   Triston Lisanti Jenita Miyamoto, DPM, FACFAS [facility]     [address] [place], [place] [address]                Office [phone]  Fax [phone]

## 2024-02-05 ENCOUNTER — Other Ambulatory Visit (HOSPITAL_BASED_OUTPATIENT_CLINIC_OR_DEPARTMENT_OTHER): Payer: Self-pay

## 2024-02-05 DIAGNOSIS — M47816 Spondylosis without myelopathy or radiculopathy, lumbar region: Secondary | ICD-10-CM | POA: Diagnosis not present

## 2024-02-05 MED ORDER — TRAMADOL HCL 50 MG PO TABS
50.0000 mg | ORAL_TABLET | Freq: Three times a day (TID) | ORAL | 1 refills | Status: DC | PRN
  Filled 2024-02-05 – 2024-02-20 (×2): qty 60, 20d supply, fill #0
  Filled 2024-03-24: qty 60, 20d supply, fill #1

## 2024-02-13 ENCOUNTER — Other Ambulatory Visit (HOSPITAL_BASED_OUTPATIENT_CLINIC_OR_DEPARTMENT_OTHER): Payer: Self-pay

## 2024-02-20 ENCOUNTER — Other Ambulatory Visit (HOSPITAL_BASED_OUTPATIENT_CLINIC_OR_DEPARTMENT_OTHER): Payer: Self-pay

## 2024-03-02 DIAGNOSIS — H04123 Dry eye syndrome of bilateral lacrimal glands: Secondary | ICD-10-CM | POA: Diagnosis not present

## 2024-03-02 DIAGNOSIS — H401131 Primary open-angle glaucoma, bilateral, mild stage: Secondary | ICD-10-CM | POA: Diagnosis not present

## 2024-03-19 DIAGNOSIS — F4542 Pain disorder with related psychological factors: Secondary | ICD-10-CM | POA: Diagnosis not present

## 2024-03-22 ENCOUNTER — Ambulatory Visit: Admitting: Podiatry

## 2024-03-22 DIAGNOSIS — M2011 Hallux valgus (acquired), right foot: Secondary | ICD-10-CM

## 2024-03-22 DIAGNOSIS — L84 Corns and callosities: Secondary | ICD-10-CM

## 2024-03-22 DIAGNOSIS — M79674 Pain in right toe(s): Secondary | ICD-10-CM | POA: Diagnosis not present

## 2024-03-22 DIAGNOSIS — B351 Tinea unguium: Secondary | ICD-10-CM | POA: Diagnosis not present

## 2024-03-22 NOTE — Progress Notes (Signed)
 Subjective:  Patient ID: Monique Wright, female    DOB: 07-Nov-1935,  MRN: [id number]  Monique Wright presents to clinic today for:  Chief Complaint  Patient presents with   corn    Pt stated that her corn is giving her some trouble    Patient presents for recheck of her corn on the medial right second toe.  This was not a scheduled appointment today.  The patient had requested this appointment.  She states her corn has come back and is quite painful.  She was asked to review and sign an Soil scientist today in case the shaving of the corn is not covered.  She has been wearing toe spacers.  States that she has not been wearing too tight of shoes.  At the end of the appointment she asked that the toenails on the right foot be trimmed today.  PCP is Suan Elm, MD.  Past Medical History:  Diagnosis Date   Anemia    Arthritis    spinal arthritis   Coronary atherosclerosis of native coronary artery    pt denies having any heart problems and never seen the person who entered this in.   Fatigue    Hypertension    Hypothyroidism    Left ventricular systolic dysfunction    Plantar fasciitis    Scoliosis    Shortness of breath    on exertion   Past Surgical History:  Procedure Laterality Date   ABDOMINAL HYSTERECTOMY     tah and bso   APPENDECTOMY     ENTEROSCOPY N/A 12/21/2013   Procedure: ENTEROSCOPY;  Surgeon: Tobin Forts, MD;  Location: [facility];  Service: Endoscopy;  Laterality: N/A;   TONSILECTOMY, ADENOIDECTOMY, BILATERAL MYRINGOTOMY AND TUBES     torn cartlidge repair Right yrs ago   Allergies  Allergen Reactions   Penicillins Hives   Pneumococcal Vac Polyvalent Other (See Comments)   Pneumococcal Vaccines Swelling   Prevnar 13 [Pneumococcal 13-Val Conj Vacc] Other (See Comments)    Review of Systems: Negative except as noted in the HPI.  Objective:  Monique Wright is a pleasant 88 y.o. female in NAD. AAO x 3.  Patient did not remove her left  shoe today.  Vascular Examination: Capillary refill time is 3-5 seconds to toes. Palpable pedal pulses . Digital hair present.  Skin temperature gradient WNL . No varicosities . No cyanosis noted.   Dermatological Examination: Pedal skin with normal turgor, texture and tone . No open wounds. No interdigital macerations . Toenails x5 right foot are 3mm thick, discolored, dystrophic with subungual debris. There is pain with compression of the nail plates.  They are elongated x5 right foot.  There is a hyperkeratotic lesion on the medial aspect of the right second toe DIPJ with pain on palpation.  There is no surrounding erythema or ulceration noted  Orthopedic examination: There is hallux valgus of the right great toe which is not fully reducible upon manipulation.  There is a bony prominence on the medial first met head consistent with bunion deformity.  The second toe has slight enlargement at the PIPJ and DIPJ condyles.  Assessment/Plan: 1. Corns   2. Hallux valgus, right   3. Dermatophytosis of nail   4. Pain in right toe(s)     The medical assistant reviewed a commercial advance to beneficiary notice/insurance waiver indicating that the corn shaving may not be covered by her insurance.  She was informed that her insurance may feel that this  is not medically necessary.  Following review and signing of the form by the patient, the corn on the medial aspect of the right second toe DIPJ was shaved with sterile #313 blade.  Patient will continue with her toe spacers.  Discussed surgical correction of the hallux valgus and/or the prominent bone at the second toe if this continues.  At the end of her appointment, she asked that her right toenails be trimmed.  She stated that with her scoliosis she has trouble reaching her feet.  The mycotic toenails were sharply debrided x 5 right foot with sterile nail nippers to decrease bulk/thickness and length.    Return if symptoms worsen or fail to  improve.   Joe Murders, DPM, FACFAS [facility]     [address] [place], [place] [address]                Office [phone]  Fax [phone]

## 2024-03-24 ENCOUNTER — Other Ambulatory Visit: Payer: Self-pay

## 2024-03-30 DIAGNOSIS — M47816 Spondylosis without myelopathy or radiculopathy, lumbar region: Secondary | ICD-10-CM | POA: Diagnosis not present

## 2024-03-30 DIAGNOSIS — G8929 Other chronic pain: Secondary | ICD-10-CM | POA: Diagnosis not present

## 2024-03-30 DIAGNOSIS — M545 Low back pain, unspecified: Secondary | ICD-10-CM | POA: Diagnosis not present

## 2024-03-30 DIAGNOSIS — Z1330 Encounter for screening examination for mental health and behavioral disorders, unspecified: Secondary | ICD-10-CM | POA: Diagnosis not present

## 2024-04-21 DIAGNOSIS — E039 Hypothyroidism, unspecified: Secondary | ICD-10-CM | POA: Diagnosis not present

## 2024-04-21 DIAGNOSIS — E785 Hyperlipidemia, unspecified: Secondary | ICD-10-CM | POA: Diagnosis not present

## 2024-04-21 DIAGNOSIS — I10 Essential (primary) hypertension: Secondary | ICD-10-CM | POA: Diagnosis not present

## 2024-04-21 DIAGNOSIS — D649 Anemia, unspecified: Secondary | ICD-10-CM | POA: Diagnosis not present

## 2024-04-21 DIAGNOSIS — M8580 Other specified disorders of bone density and structure, unspecified site: Secondary | ICD-10-CM | POA: Diagnosis not present

## 2024-04-21 DIAGNOSIS — Z79899 Other long term (current) drug therapy: Secondary | ICD-10-CM | POA: Diagnosis not present

## 2024-04-26 ENCOUNTER — Other Ambulatory Visit (HOSPITAL_BASED_OUTPATIENT_CLINIC_OR_DEPARTMENT_OTHER): Payer: Self-pay

## 2024-04-26 MED ORDER — TRAMADOL HCL 50 MG PO TABS
50.0000 mg | ORAL_TABLET | Freq: Two times a day (BID) | ORAL | 0 refills | Status: DC
  Filled 2024-04-26: qty 60, 30d supply, fill #0

## 2024-04-28 DIAGNOSIS — Z1339 Encounter for screening examination for other mental health and behavioral disorders: Secondary | ICD-10-CM | POA: Diagnosis not present

## 2024-04-28 DIAGNOSIS — Z1331 Encounter for screening for depression: Secondary | ICD-10-CM | POA: Diagnosis not present

## 2024-04-28 DIAGNOSIS — I351 Nonrheumatic aortic (valve) insufficiency: Secondary | ICD-10-CM | POA: Diagnosis not present

## 2024-04-28 DIAGNOSIS — Z0000 Encounter for general adult medical examination without abnormal findings: Secondary | ICD-10-CM | POA: Diagnosis not present

## 2024-04-28 DIAGNOSIS — I471 Supraventricular tachycardia, unspecified: Secondary | ICD-10-CM | POA: Diagnosis not present

## 2024-04-28 DIAGNOSIS — I350 Nonrheumatic aortic (valve) stenosis: Secondary | ICD-10-CM | POA: Diagnosis not present

## 2024-04-28 DIAGNOSIS — E785 Hyperlipidemia, unspecified: Secondary | ICD-10-CM | POA: Diagnosis not present

## 2024-04-28 DIAGNOSIS — R82998 Other abnormal findings in urine: Secondary | ICD-10-CM | POA: Diagnosis not present

## 2024-04-28 DIAGNOSIS — I10 Essential (primary) hypertension: Secondary | ICD-10-CM | POA: Diagnosis not present

## 2024-04-28 DIAGNOSIS — E039 Hypothyroidism, unspecified: Secondary | ICD-10-CM | POA: Diagnosis not present

## 2024-04-28 DIAGNOSIS — M419 Scoliosis, unspecified: Secondary | ICD-10-CM | POA: Diagnosis not present

## 2024-05-10 DIAGNOSIS — G8929 Other chronic pain: Secondary | ICD-10-CM | POA: Diagnosis not present

## 2024-05-10 DIAGNOSIS — M47816 Spondylosis without myelopathy or radiculopathy, lumbar region: Secondary | ICD-10-CM | POA: Diagnosis not present

## 2024-05-10 DIAGNOSIS — M545 Low back pain, unspecified: Secondary | ICD-10-CM | POA: Diagnosis not present

## 2024-06-02 DIAGNOSIS — L814 Other melanin hyperpigmentation: Secondary | ICD-10-CM | POA: Diagnosis not present

## 2024-06-02 DIAGNOSIS — D692 Other nonthrombocytopenic purpura: Secondary | ICD-10-CM | POA: Diagnosis not present

## 2024-06-02 DIAGNOSIS — L821 Other seborrheic keratosis: Secondary | ICD-10-CM | POA: Diagnosis not present

## 2024-06-02 DIAGNOSIS — D2271 Melanocytic nevi of right lower limb, including hip: Secondary | ICD-10-CM | POA: Diagnosis not present

## 2024-06-02 DIAGNOSIS — L570 Actinic keratosis: Secondary | ICD-10-CM | POA: Diagnosis not present

## 2024-06-02 DIAGNOSIS — L738 Other specified follicular disorders: Secondary | ICD-10-CM | POA: Diagnosis not present

## 2024-06-03 ENCOUNTER — Other Ambulatory Visit (HOSPITAL_BASED_OUTPATIENT_CLINIC_OR_DEPARTMENT_OTHER): Payer: Self-pay

## 2024-06-03 DIAGNOSIS — G8929 Other chronic pain: Secondary | ICD-10-CM | POA: Diagnosis not present

## 2024-06-03 DIAGNOSIS — M545 Low back pain, unspecified: Secondary | ICD-10-CM | POA: Diagnosis not present

## 2024-06-03 DIAGNOSIS — M5416 Radiculopathy, lumbar region: Secondary | ICD-10-CM | POA: Diagnosis not present

## 2024-06-03 DIAGNOSIS — M47816 Spondylosis without myelopathy or radiculopathy, lumbar region: Secondary | ICD-10-CM | POA: Diagnosis not present

## 2024-06-03 MED ORDER — GABAPENTIN 100 MG PO CAPS
100.0000 mg | ORAL_CAPSULE | Freq: Every day | ORAL | 0 refills | Status: DC
  Filled 2024-06-03: qty 90, 30d supply, fill #0

## 2024-06-09 DIAGNOSIS — M48061 Spinal stenosis, lumbar region without neurogenic claudication: Secondary | ICD-10-CM | POA: Diagnosis not present

## 2024-06-09 DIAGNOSIS — Q7649 Other congenital malformations of spine, not associated with scoliosis: Secondary | ICD-10-CM | POA: Diagnosis not present

## 2024-06-09 DIAGNOSIS — M4726 Other spondylosis with radiculopathy, lumbar region: Secondary | ICD-10-CM | POA: Diagnosis not present

## 2024-06-09 DIAGNOSIS — M419 Scoliosis, unspecified: Secondary | ICD-10-CM | POA: Diagnosis not present

## 2024-06-09 DIAGNOSIS — M4807 Spinal stenosis, lumbosacral region: Secondary | ICD-10-CM | POA: Diagnosis not present

## 2024-06-14 ENCOUNTER — Other Ambulatory Visit (HOSPITAL_COMMUNITY): Payer: Self-pay

## 2024-06-14 ENCOUNTER — Other Ambulatory Visit (HOSPITAL_BASED_OUTPATIENT_CLINIC_OR_DEPARTMENT_OTHER): Payer: Self-pay

## 2024-06-14 MED ORDER — PREGABALIN 25 MG PO CAPS
25.0000 mg | ORAL_CAPSULE | ORAL | 0 refills | Status: DC
  Filled 2024-06-14: qty 60, 30d supply, fill #0

## 2024-06-15 ENCOUNTER — Other Ambulatory Visit (HOSPITAL_BASED_OUTPATIENT_CLINIC_OR_DEPARTMENT_OTHER): Payer: Self-pay

## 2024-06-15 MED ORDER — PREGABALIN 25 MG PO CAPS
25.0000 mg | ORAL_CAPSULE | ORAL | 0 refills | Status: DC
  Filled 2024-06-15: qty 60, 30d supply, fill #0

## 2024-06-17 ENCOUNTER — Other Ambulatory Visit (HOSPITAL_BASED_OUTPATIENT_CLINIC_OR_DEPARTMENT_OTHER): Payer: Self-pay

## 2024-06-17 DIAGNOSIS — M47816 Spondylosis without myelopathy or radiculopathy, lumbar region: Secondary | ICD-10-CM | POA: Diagnosis not present

## 2024-06-17 DIAGNOSIS — M5416 Radiculopathy, lumbar region: Secondary | ICD-10-CM | POA: Diagnosis not present

## 2024-06-17 MED ORDER — HYDROCODONE-ACETAMINOPHEN 5-325 MG PO TABS
1.0000 | ORAL_TABLET | Freq: Four times a day (QID) | ORAL | 0 refills | Status: DC | PRN
  Filled 2024-06-17: qty 20, 5d supply, fill #0

## 2024-06-23 ENCOUNTER — Other Ambulatory Visit (HOSPITAL_BASED_OUTPATIENT_CLINIC_OR_DEPARTMENT_OTHER): Payer: Self-pay

## 2024-06-23 DIAGNOSIS — M5416 Radiculopathy, lumbar region: Secondary | ICD-10-CM | POA: Diagnosis not present

## 2024-06-23 MED ORDER — HYDROCODONE-ACETAMINOPHEN 5-325 MG PO TABS
1.0000 | ORAL_TABLET | Freq: Three times a day (TID) | ORAL | 0 refills | Status: DC | PRN
  Filled 2024-06-23: qty 30, 10d supply, fill #0

## 2024-06-30 ENCOUNTER — Other Ambulatory Visit (HOSPITAL_BASED_OUTPATIENT_CLINIC_OR_DEPARTMENT_OTHER): Payer: Self-pay

## 2024-06-30 DIAGNOSIS — F1190 Opioid use, unspecified, uncomplicated: Secondary | ICD-10-CM | POA: Diagnosis not present

## 2024-06-30 DIAGNOSIS — M5116 Intervertebral disc disorders with radiculopathy, lumbar region: Secondary | ICD-10-CM | POA: Diagnosis not present

## 2024-06-30 DIAGNOSIS — M479 Spondylosis, unspecified: Secondary | ICD-10-CM | POA: Diagnosis not present

## 2024-06-30 MED ORDER — HYDROCODONE-ACETAMINOPHEN 5-325 MG PO TABS
1.0000 | ORAL_TABLET | Freq: Three times a day (TID) | ORAL | 0 refills | Status: DC | PRN
  Filled 2024-07-05: qty 90, 30d supply, fill #0

## 2024-07-05 ENCOUNTER — Other Ambulatory Visit (HOSPITAL_BASED_OUTPATIENT_CLINIC_OR_DEPARTMENT_OTHER): Payer: Self-pay

## 2024-07-13 DIAGNOSIS — H04123 Dry eye syndrome of bilateral lacrimal glands: Secondary | ICD-10-CM | POA: Diagnosis not present

## 2024-07-13 DIAGNOSIS — H524 Presbyopia: Secondary | ICD-10-CM | POA: Diagnosis not present

## 2024-07-13 DIAGNOSIS — H401131 Primary open-angle glaucoma, bilateral, mild stage: Secondary | ICD-10-CM | POA: Diagnosis not present

## 2024-07-28 ENCOUNTER — Other Ambulatory Visit (HOSPITAL_BASED_OUTPATIENT_CLINIC_OR_DEPARTMENT_OTHER): Payer: Self-pay

## 2024-07-28 DIAGNOSIS — Z79891 Long term (current) use of opiate analgesic: Secondary | ICD-10-CM | POA: Diagnosis not present

## 2024-07-28 DIAGNOSIS — M5416 Radiculopathy, lumbar region: Secondary | ICD-10-CM | POA: Diagnosis not present

## 2024-07-28 DIAGNOSIS — M791 Myalgia, unspecified site: Secondary | ICD-10-CM | POA: Diagnosis not present

## 2024-07-28 MED ORDER — HYDROCODONE-ACETAMINOPHEN 5-325 MG PO TABS
1.0000 | ORAL_TABLET | ORAL | 0 refills | Status: DC
  Filled 2024-07-28: qty 120, 20d supply, fill #0

## 2024-08-02 ENCOUNTER — Encounter: Payer: Self-pay | Admitting: Podiatry

## 2024-08-02 ENCOUNTER — Ambulatory Visit: Admitting: Podiatry

## 2024-08-02 ENCOUNTER — Other Ambulatory Visit: Payer: Self-pay | Admitting: Cardiology

## 2024-08-02 DIAGNOSIS — L600 Ingrowing nail: Secondary | ICD-10-CM

## 2024-08-02 NOTE — Progress Notes (Unsigned)
  Chief Complaint  Patient presents with   Nail Problem    Infection on great toe left foot. 0 pain. Using triple antibiotic ointment. Non diabetic.Pt. states she cut nails 2 weeks ago and now great toe on left foot sore.   HPI: 88 y.o. female presents today with concern of an ingrown toenail to the left great toe.  She states the sides of grow down into the skin, but she is usually able to trim it on her own.  Points to the medial side saying that that the tender area today.  She has been putting antibiotic ointment on the area and notes that it feels better today.  Denies any active drainage.  Past Medical History:  Diagnosis Date   Anemia    Arthritis    spinal arthritis   Coronary atherosclerosis of native coronary artery    pt denies having any heart problems and never seen the person who entered this in.   Fatigue    Hypertension    Hypothyroidism    Left ventricular systolic dysfunction    Plantar fasciitis    Scoliosis    Shortness of breath    on exertion   Past Surgical History:  Procedure Laterality Date   ABDOMINAL HYSTERECTOMY     tah and bso   APPENDECTOMY     ENTEROSCOPY N/A 12/21/2013   Procedure: ENTEROSCOPY;  Surgeon: Norleen LOISE Kiang, MD;  Location: [facility];  Service: Endoscopy;  Laterality: N/A;   TONSILECTOMY, ADENOIDECTOMY, BILATERAL MYRINGOTOMY AND TUBES     torn cartlidge repair Right yrs ago   Allergies  Allergen Reactions   Penicillins Hives   Pneumococcal Vac Polyvalent Other (See Comments)   Pneumococcal Vaccines Swelling   Prevnar 13 [Pneumococcal 13-Val Conj Vacc] Other (See Comments)     Physical Exam: Left hallux medial nail border is incurvated with some tenderness on palpation.  No surrounding erythema or edema is noted.  No drainage or exudate is appreciated.  Epicritic sensation is intact.  There are palpable pedal pulses.  Assessment/Plan of Care: 1. Ingrown toenail    The left hallux medial nail border was cut back 50% towards the  eponychium.  No purulence was expressed.  Antibiotic ointment was applied to the area.  She will continue with triple antibiotic ointment, once daily, for the next 4 to 5 days to the nail groove.  Discussed a PNA procedure if this continues to give her an issue.  Otherwise follow-up as needed   Awanda CHARM Imperial, DPM, FACFAS [facility]     [address] [place], [place] [address]                Office [phone]  Fax [phone]

## 2024-08-03 ENCOUNTER — Other Ambulatory Visit (HOSPITAL_BASED_OUTPATIENT_CLINIC_OR_DEPARTMENT_OTHER): Payer: Self-pay

## 2024-08-03 MED ORDER — COMIRNATY 30 MCG/0.3ML IM SUSY
0.3000 mL | PREFILLED_SYRINGE | Freq: Once | INTRAMUSCULAR | 0 refills | Status: AC
  Filled 2024-08-03: qty 0.3, 1d supply, fill #0

## 2024-08-12 ENCOUNTER — Other Ambulatory Visit (HOSPITAL_BASED_OUTPATIENT_CLINIC_OR_DEPARTMENT_OTHER): Payer: Self-pay

## 2024-08-12 ENCOUNTER — Telehealth: Payer: Self-pay | Admitting: Cardiology

## 2024-08-12 MED ORDER — CARVEDILOL 6.25 MG PO TABS
6.2500 mg | ORAL_TABLET | Freq: Two times a day (BID) | ORAL | 0 refills | Status: DC
  Filled 2024-08-12: qty 180, 90d supply, fill #0

## 2024-08-12 NOTE — Telephone Encounter (Signed)
 RX sent in

## 2024-08-12 NOTE — Telephone Encounter (Signed)
*  STAT* If patient is at the pharmacy, call can be transferred to refill team.   1. Which medications need to be refilled? (please list name of each medication and dose if known) carvedilol  (COREG ) 6.25 MG tablet    2. Would you like to learn more about the convenience, safety, & potential cost savings by using the [facility]?     3. Are you open to using the [facility] (Type [facility].  ).   4. Which pharmacy/location (including street and city if local pharmacy) is medication to be sent to? [facility]    5. Do they need a 30 day or 90 day supply? 90 day

## 2024-09-08 ENCOUNTER — Other Ambulatory Visit (HOSPITAL_BASED_OUTPATIENT_CLINIC_OR_DEPARTMENT_OTHER): Payer: Self-pay

## 2024-09-08 DIAGNOSIS — M791 Myalgia, unspecified site: Secondary | ICD-10-CM | POA: Diagnosis not present

## 2024-09-08 DIAGNOSIS — M479 Spondylosis, unspecified: Secondary | ICD-10-CM | POA: Diagnosis not present

## 2024-09-08 DIAGNOSIS — M5416 Radiculopathy, lumbar region: Secondary | ICD-10-CM | POA: Diagnosis not present

## 2024-09-08 MED ORDER — HYDROCODONE-ACETAMINOPHEN 5-325 MG PO TABS
ORAL_TABLET | ORAL | 0 refills | Status: DC
  Filled 2024-09-08: qty 120, 30d supply, fill #0

## 2024-10-04 ENCOUNTER — Ambulatory Visit: Admitting: Podiatry

## 2024-10-04 ENCOUNTER — Encounter: Payer: Self-pay | Admitting: Podiatry

## 2024-10-04 DIAGNOSIS — M79674 Pain in right toe(s): Secondary | ICD-10-CM

## 2024-10-04 DIAGNOSIS — M79675 Pain in left toe(s): Secondary | ICD-10-CM

## 2024-10-04 DIAGNOSIS — B351 Tinea unguium: Secondary | ICD-10-CM

## 2024-10-04 NOTE — Progress Notes (Signed)
 Subjective:   Patient ID: Monique Wright, female   DOB: 88 y.o.   MRN: [id number]   HPI Patient presents elongated thickened nailbeds 1-5 both feet that she cannot take care of   ROS      Objective:  Physical Exam  Neurovascular status unchanged thick yellow brittle nailbeds 1-5 both feet that are painful     Assessment:  Mycotic nail infection with pain 1-5 both feet     Plan:  Debridement painful nailbeds 1-5 both feet no iatrogenic bleeding reappoint routine care

## 2024-10-12 ENCOUNTER — Ambulatory Visit: Attending: Cardiology | Admitting: Cardiology

## 2024-10-12 ENCOUNTER — Encounter: Payer: Self-pay | Admitting: Cardiology

## 2024-10-12 VITALS — BP 132/84 | HR 65 | Ht 63.0 in | Wt 136.4 lb

## 2024-10-12 DIAGNOSIS — E782 Mixed hyperlipidemia: Secondary | ICD-10-CM

## 2024-10-12 DIAGNOSIS — I491 Atrial premature depolarization: Secondary | ICD-10-CM | POA: Diagnosis not present

## 2024-10-12 DIAGNOSIS — I350 Nonrheumatic aortic (valve) stenosis: Secondary | ICD-10-CM | POA: Diagnosis not present

## 2024-10-12 DIAGNOSIS — I351 Nonrheumatic aortic (valve) insufficiency: Secondary | ICD-10-CM | POA: Diagnosis not present

## 2024-10-12 DIAGNOSIS — I471 Supraventricular tachycardia, unspecified: Secondary | ICD-10-CM

## 2024-10-12 DIAGNOSIS — I10 Essential (primary) hypertension: Secondary | ICD-10-CM | POA: Diagnosis not present

## 2024-10-12 NOTE — Patient Instructions (Signed)
 Medication Instructions:  Your physician recommends that you continue on your current medications as directed. Please refer to the Current Medication list given to you today.  *If you need a refill on your cardiac medications before your next appointment, please call your pharmacy*  Testing/Procedures: Your physician has requested that you have an echocardiogram. Echocardiography is a painless test that uses sound waves to create images of your heart. It provides your doctor with information about the size and shape of your heart and how well your heart's chambers and valves are working. This procedure takes approximately one hour. There are no restrictions for this procedure. Please do NOT wear cologne, perfume, aftershave, or lotions (deodorant is allowed). Please arrive 15 minutes prior to your appointment time.  Please note: We ask at that you not bring children with you during ultrasound (echo/ vascular) testing. Due to room size and safety concerns, children are not allowed in the ultrasound rooms during exams. Our front office staff cannot provide observation of children in our lobby area while testing is being conducted. An adult accompanying a patient to their appointment will only be allowed in the ultrasound room at the discretion of the ultrasound technician under special circumstances. We apologize for any inconvenience.   Follow-Up: At [facility], you and your health needs are our priority.  As part of our continuing mission to provide you with exceptional heart care, our providers are all part of one team.  This team includes your primary Cardiologist (physician) and Advanced Practice Providers or APPs (Physician Assistants and Nurse Practitioners) who all work together to provide you with the care you need, when you need it.  Your next appointment:   6 month(s)  Provider:   Kardie Tobb, DO

## 2024-10-12 NOTE — Progress Notes (Signed)
 " Cardiology Office Note:    Date:  10/12/2024   ID:  Monique Wright, DOB [birth year], MRN [id number]  PCP:  Shepard Ade, MD  Cardiologist:  Dub Huntsman, DO  Electrophysiologist:  None   Referring MD: Shepard Ade, MD    I am doing well  History of Present Illness:    Monique Wright is a 88 y.o. female with a hx of moderate aortic regurgitation, moderate aortic valve stenosis, mitral regurgitation, paroxysmal SVT, PACs here today for follow-up visit.   She presents for a routine follow-up.  Today she offers no cardiovascular complaints.  She tells me that she is only limited by her scoliosis.  She is active she does her ADLs no other complaints at this time.    Past Medical History:  Diagnosis Date   Anemia    Arthritis    spinal arthritis   Coronary atherosclerosis of native coronary artery    pt denies having any heart problems and never seen the person who entered this in.   Fatigue    Hypertension    Hypothyroidism    Left ventricular systolic dysfunction    Plantar fasciitis    Scoliosis    Shortness of breath    on exertion    Past Surgical History:  Procedure Laterality Date   ABDOMINAL HYSTERECTOMY     tah and bso   APPENDECTOMY     ENTEROSCOPY N/A 12/21/2013   Procedure: ENTEROSCOPY;  Surgeon: Norleen LOISE Kiang, MD;  Location: [facility];  Service: Endoscopy;  Laterality: N/A;   TONSILECTOMY, ADENOIDECTOMY, BILATERAL MYRINGOTOMY AND TUBES     torn cartlidge repair Right yrs ago    Current Medications: Current Meds  Medication Sig   carvedilol  (COREG ) 6.25 MG tablet Take 1 tablet (6.25 mg total) by mouth 2 (two) times daily.   Cholecalciferol (VITAMIN D3) 50 MCG (2000 UT) CAPS Take 50 mcg by mouth daily.   levothyroxine (SYNTHROID, LEVOTHROID) 50 MCG tablet Take 1 tablet by mouth daily before breakfast.    LUMIGAN 0.01 % SOLN 1 drop at bedtime.   mupirocin  ointment (BACTROBAN ) 2 % Apply a small amount to affected area twice a day as directed  to left ear   omeprazole  (PRILOSEC) 40 MG capsule TAKE 1 CAPSULE BY MOUTH TWICE A DAY   rosuvastatin (CRESTOR) 10 MG tablet Take 10 mg by mouth daily.   traZODone (DESYREL) 100 MG tablet Take 100 mg by mouth at bedtime.    valsartan-hydrochlorothiazide (DIOVAN-HCT) 320-25 MG per tablet Take 1 tablet by mouth every morning.      Allergies:   Penicillins, Pneumococcal vac polyvalent, Pneumococcal vaccines, and Prevnar 13 [pneumococcal 13-val conj vacc]   Social History   Socioeconomic History   Marital status: Married    Spouse name: Not on file   Number of children: Not on file   Years of education: Not on file   Highest education level: Not on file  Occupational History   Occupation: retired Teacher, Adult Education: [employer]  Tobacco Use   Smoking status: Never   Smokeless tobacco: Never  Vaping Use   Vaping status: Never Used  Substance and Sexual Activity   Alcohol  use: Yes    Comment: occasional   Drug use: No   Sexual activity: Never    Partners: Male  Other Topics Concern   Not on file  Social History Narrative   Not on file   Social Drivers of Health   Tobacco Use: Low Risk (10/12/2024)  Patient History    Smoking Tobacco Use: Never    Smokeless Tobacco Use: Never    Passive Exposure: Not on file  Financial Resource Strain: Low Risk (03/30/2024)   Received from [facility]   Overall Financial Resource Strain (CARDIA)    Difficulty of Paying Living Expenses: Not hard at all  Food Insecurity: No Food Insecurity (03/30/2024)   Received from [facility]   Epic    Within the past 12 months, you worried that your food would run out before you got the money to buy more.: Never true    Within the past 12 months, the food you bought just didn't last and you didn't have money to get more.: Never true  Transportation Needs: No Transportation Needs (03/30/2024)   Received from [facility]    Lack of Transportation (Medical): No     Lack of Transportation (Non-Medical): No  Physical Activity: Not on file  Stress: Not on file  Social Connections: Not on file  Depression (EYV7-0): Not on file  Alcohol  Screen: Not on file  Housing: Low Risk (03/30/2024)   Received from [place]    In the last 12 months, was there a time when you were not able to pay the mortgage or rent on time?: No    In the past 12 months, how many times have you moved where you were living?: 0    At any time in the past 12 months, were you homeless or living in a shelter (including now)?: No  Utilities: Not At Risk (03/30/2024)   Received from [facility]ities    Threatened with loss of utilities: No  Health Literacy: Not on file     Family History: The patient's family history includes Heart attack in her mother; Lung cancer in her father. There is no history of Stomach cancer, Colon cancer, or Breast cancer.  ROS:   Review of Systems  Constitution: Negative for decreased appetite, fever and weight gain.  HENT: Negative for congestion, ear discharge, hoarse voice and sore throat.   Eyes: Negative for discharge, redness, vision loss in right eye and visual halos.  Cardiovascular: Negative for chest pain, dyspnea on exertion, leg swelling, orthopnea and palpitations.  Respiratory: Negative for cough, hemoptysis, shortness of breath and snoring.   Endocrine: Negative for heat intolerance and polyphagia.  Hematologic/Lymphatic: Negative for bleeding problem. Does not bruise/bleed easily.  Skin: Negative for flushing, nail changes, rash and suspicious lesions.  Musculoskeletal: Negative for arthritis, joint pain, muscle cramps, myalgias, neck pain and stiffness.  Gastrointestinal: Negative for abdominal pain, bowel incontinence, diarrhea and excessive appetite.  Genitourinary: Negative for decreased libido, genital sores and incomplete emptying.  Neurological: Negative for brief paralysis, focal weakness, headaches and loss  of balance.  Psychiatric/Behavioral: Negative for altered mental status, depression and suicidal ideas.  Allergic/Immunologic: Negative for HIV exposure and persistent infections.    EKGs/Labs/Other Studies Reviewed:    The following studies were reviewed today:   EKG: None today  Tte 01/13/2023 IMPRESSIONS     1. Left ventricular ejection fraction, by estimation, is 60 to 65%. Left  ventricular ejection fraction by 3D volume is 60 %. The left ventricle has  normal function. The left ventricle has no regional wall motion  abnormalities. There is mild left  ventricular hypertrophy. Left ventricular diastolic parameters are  consistent with Grade I diastolic dysfunction (impaired relaxation). The  average left ventricular global longitudinal strain is -19.2 %.  2. Right ventricular systolic function is normal. The right ventricular  size is normal. There is normal pulmonary artery systolic pressure. The  estimated right ventricular systolic pressure is 21.8 mmHg.   3. The mitral valve is normal in structure. Trivial mitral valve  regurgitation. No evidence of mitral stenosis.   4. The aortic valve is calcified. There is moderate calcification of the  aortic valve. Aortic valve regurgitation is moderate. Moderate aortic  valve stenosis. Vmax 2.9 m/s, MG , AVA 1.1 cm^2, DI 0.32   5. Aortic dilatation noted. There is mild dilatation of the ascending  aorta, measuring 39 mm.   6. The inferior vena cava is normal in size with greater than 50%  respiratory variability, suggesting right atrial pressure of 3 mmHg.   FINDINGS   Left Ventricle: Left ventricular ejection fraction, by estimation, is 60  to 65%. Left ventricular ejection fraction by 3D volume is 60 %. The left  ventricle has normal function. The left ventricle has no regional wall  motion abnormalities. The average  left ventricular global longitudinal strain is -19.2 %. The left  ventricular internal cavity size was  small. There is mild left ventricular  hypertrophy. Left ventricular diastolic parameters are consistent with  Grade I diastolic dysfunction (impaired  relaxation).   Right Ventricle: The right ventricular size is normal. No increase in  right ventricular wall thickness. Right ventricular systolic function is  normal. There is normal pulmonary artery systolic pressure. The tricuspid  regurgitant velocity is 2.17 m/s, and   with an assumed right atrial pressure of 3 mmHg, the estimated right  ventricular systolic pressure is 21.8 mmHg.   Left Atrium: Left atrial size was normal in size.   Right Atrium: Right atrial size was normal in size.   Pericardium: There is no evidence of pericardial effusion.   Mitral Valve: The mitral valve is normal in structure. Trivial mitral  valve regurgitation. No evidence of mitral valve stenosis.   Tricuspid Valve: The tricuspid valve is normal in structure. Tricuspid  valve regurgitation is mild.   Aortic Valve: The aortic valve is calcified. There is moderate  calcification of the aortic valve. Aortic valve regurgitation is moderate.  Aortic regurgitation PHT measures 216 msec. Moderate aortic stenosis is  present. Aortic valve mean gradient measures   19.0 mmHg. Aortic valve peak gradient measures 33.6 mmHg. Aortic valve  area, by VTI measures 1.14 cm.   Pulmonic Valve: The pulmonic valve was not well visualized. Pulmonic valve  regurgitation is trivial.   Aorta: The aortic root is normal in size and structure and aortic  dilatation noted. There is mild dilatation of the ascending aorta,  measuring 39 mm.   Venous: The inferior vena cava is normal in size with greater than 50%  respiratory variability, suggesting right atrial pressure of 3 mmHg.   IAS/Shunts: The interatrial septum was not well visualized.       TTE 11/09/2021 IMPRESSIONS   1. Left ventricular ejection fraction, by estimation, is 60 to 65%. The  left ventricle has  normal function. The left ventricle has no regional  wall motion abnormalities. Left ventricular diastolic parameters are  consistent with Grade I diastolic  dysfunction (impaired relaxation).   2. Right ventricular systolic function is normal. The right ventricular  size is normal.   3. The pericardial effusion is posterior to the left ventricle.   4. The mitral valve is abnormal. Mild mitral valve regurgitation. No  evidence of mitral stenosis.   5. The aortic  valve is calcified. There is moderate calcification of the  aortic valve. Aortic valve regurgitation is moderate. Mild to moderate  aortic valve stenosis.   6. The inferior vena cava is normal in size with greater than 50%  respiratory variability, suggesting right atrial pressure of 3 mmHg.   FINDINGS   Left Ventricle: Left ventricular ejection fraction, by estimation, is 60  to 65%. The left ventricle has normal function. The left ventricle has no  regional wall motion abnormalities. The left ventricular internal cavity  size was normal in size. There is   no left ventricular hypertrophy. Left ventricular diastolic parameters  are consistent with Grade I diastolic dysfunction (impaired relaxation).   Right Ventricle: The right ventricular size is normal. No increase in  right ventricular wall thickness. Right ventricular systolic function is  normal.   Left Atrium: Left atrial size was normal in size.   Right Atrium: Right atrial size was normal in size.   Pericardium: Trivial pericardial effusion is present. The pericardial effusion is posterior to the left ventricle.   Mitral Valve: The mitral valve is abnormal. There is mild thickening of the mitral valve leaflet(s). There is mild calcification of the mitral valve leaflet(s). Mild mitral valve regurgitation. No evidence of mitral valve stenosis.   Tricuspid Valve: The tricuspid valve is normal in structure. Tricuspid valve regurgitation is mild . No evidence of  tricuspid stenosis.   Aortic Valve: The aortic valve is calcified. There is moderate calcification of the aortic valve. Aortic valve regurgitation is moderate. Aortic regurgitation PHT measures 927 msec. Mild to moderate aortic  stenosis is present. Aortic valve mean gradient  measures 10.3 mmHg. Aortic valve peak gradient measures 18.7 mmHg. Aortic  valve area, by VTI measures 1.10 cm.   Pulmonic Valve: The pulmonic valve was normal in structure. Pulmonic valve  regurgitation is trivial. No evidence of pulmonic stenosis.   Aorta: The aortic root is normal in size and structure.   Venous: The inferior vena cava is normal in size with greater than 50%  respiratory variability, suggesting right atrial pressure of 3 mmHg.   IAS/Shunts: No atrial level shunt detected by color flow Doppler.      Zio monitor  Patch Wear Time:  10 days and 9 hours (2023-01-05T10:25:46-0500 to 2023-01-15T20:15:44-0500)   Indication: Premature ventricular complexes.   Patient had a min HR of 55 bpm, max HR of 190 bpm, and avg HR of 78 bpm. Predominant underlying rhythm was Sinus Rhythm.    442 Supraventricular Tachycardia runs occurred, the run with the fastest interval lasting 4 beats with a max rate of 190 bpm, the  longest lasting 33.0 secs with an avg rate of 144 bpm. Supraventricular Tachycardia was detected within +/- 45 seconds of symptomatic patient event(s). Isolated SVEs were occasional (1.3%, 14444), SVE Couplets were rare (<1.0%, 1342), and SVE Triplets  were rare (<1.0%, 405). Isolated VEs were rare (<1.0%), and no VE Couplets or VE Triplets were present.   Symptoms associated with supraventricular tachycardia and premature ventricular complex.  Conclusion: This study is remarkable for the following:                                  1.  Symptomatic paroxysmal supraventricular tachycardia.  2.  Symptomatic occasional premature atrial complexes.    Recent  Labs: No results found for requested labs within last 365 days.  Recent Lipid Panel No results found for: CHOL, TRIG, HDL, CHOLHDL, VLDL, LDLCALC, LDLDIRECT  Physical Exam:    VS:  BP 132/84   Pulse 65   Ht 5' 3 (1.6 m)   Wt 136 lb 6.4 oz (61.9 kg)   SpO2 98%   BMI 24.16 kg/m     Wt Readings from Last 3 Encounters:  10/12/24 136 lb 6.4 oz (61.9 kg)  02/02/24 139 lb (63 kg)  12/17/23 139 lb 9.6 oz (63.3 kg)     GEN: Well nourished, well developed in no acute distress HEENT: Normal NECK: No JVD; No carotid bruits LYMPHATICS: No lymphadenopathy CARDIAC: S1S2 noted,RRR, no murmurs, rubs, gallops RESPIRATORY:  Clear to auscultation without rales, wheezing or rhonchi  ABDOMEN: Soft, non-tender, non-distended, +bowel sounds, no guarding. EXTREMITIES: No edema, No cyanosis, no clubbing MUSCULOSKELETAL:  No deformity  SKIN: Warm and dry NEUROLOGIC:  Alert and oriented x 3, non-focal PSYCHIATRIC:  Normal affect, good insight  ASSESSMENT:    1. Essential hypertension   2. Mixed hyperlipidemia   3. PSVT (paroxysmal supraventricular tachycardia)   4. PAC (premature atrial contraction)   5. Moderate aortic regurgitation   6. Nonrheumatic aortic valve stenosis   7. Aortic valve stenosis, etiology of cardiac valve disease unspecified    PLAN:    Hypertension Well controlled. -Continue current antihypertensive regimen. -Continue home blood pressure monitoring.  Hyperlipidemia Last blood work in July 2025, LDL 59, triglyceride 115, HDL 46, total cholesterol 871 -Continue current lipid-lowering medication.  General Health Maintenance / Followup Plans -Schedule follow-up appointment in 6 months.  Moderate aortic stenosis - will repeat echo to reassess aortic valve  The patient is in agreement with the above plan. The patient left the office in stable condition.  The patient will follow up in   Medication Adjustments/Labs and Tests Ordered: Current medicines  are reviewed at length with the patient today.  Concerns regarding medicines are outlined above.  Orders Placed This Encounter  Procedures   EKG 12-Lead   ECHOCARDIOGRAM COMPLETE   No orders of the defined types were placed in this encounter.   Patient Instructions  Medication Instructions:  Your physician recommends that you continue on your current medications as directed. Please refer to the Current Medication list given to you today.  *If you need a refill on your cardiac medications before your next appointment, please call your pharmacy*   Testing/Procedures: Your physician has requested that you have an echocardiogram. Echocardiography is a painless test that uses sound waves to create images of your heart. It provides your doctor with information about the size and shape of your heart and how well your hearts chambers and valves are working. This procedure takes approximately one hour. There are no restrictions for this procedure. Please do NOT wear cologne, perfume, aftershave, or lotions (deodorant is allowed). Please arrive 15 minutes prior to your appointment time.  Please note: We ask at that you not bring children with you during ultrasound (echo/ vascular) testing. Due to room size and safety concerns, children are not allowed in the ultrasound rooms during exams. Our front office staff cannot provide observation of children in our lobby area while testing is being conducted. An adult accompanying a patient to their appointment will only be allowed in the ultrasound room at the discretion of the ultrasound technician under special circumstances. We apologize for  any inconvenience.   Follow-Up: At [facility], you and your health needs are our priority.  As part of our continuing mission to provide you with exceptional heart care, our providers are all part of one team.  This team includes your primary Cardiologist (physician) and Advanced Practice Providers or APPs  (Physician Assistants and Nurse Practitioners) who all work together to provide you with the care you need, when you need it.  Your next appointment:   6 month(s)  Provider:   Adeyemi Hamad, DO     Adopting a Healthy Lifestyle.  Know what a healthy weight is for you (roughly BMI <25) and aim to maintain this   Aim for 7+ servings of fruits and vegetables daily   65-80+ fluid ounces of water or unsweet tea for healthy kidneys   Limit to max 1 drink of alcohol  per day; avoid smoking/tobacco   Limit animal fats in diet for cholesterol and heart health - choose grass fed whenever available   Avoid highly processed foods, and foods high in saturated/trans fats   Aim for low stress - take time to unwind and care for your mental health   Aim for 150 min of moderate intensity exercise weekly for heart health, and weights twice weekly for bone health   Aim for 7-9 hours of sleep daily   When it comes to diets, agreement about the perfect plan isnt easy to find, even among the experts. Experts at the [facility] of [employer] developed an idea known as the Healthy Eating Plate. Just imagine a plate divided into logical, healthy portions.   The emphasis is on diet quality:   Load up on vegetables and fruits - one-half of your plate: Aim for color and variety, and remember that potatoes dont count.   Go for whole grains - one-quarter of your plate: Whole wheat, barley, wheat berries, quinoa, oats, brown rice, and foods made with them. If you want pasta, go with whole wheat pasta.   Protein power - one-quarter of your plate: Fish, chicken, beans, and nuts are all healthy, versatile protein sources. Limit red meat.   The diet, however, does go beyond the plate, offering a few other suggestions.   Use healthy plant oils, such as olive, canola, soy, corn, sunflower and peanut. Check the labels, and avoid partially hydrogenated oil, which have unhealthy trans fats.   If youre  thirsty, drink water. Coffee and tea are good in moderation, but skip sugary drinks and limit milk and dairy products to one or two daily servings.   The type of carbohydrate in the diet is more important than the amount. Some sources of carbohydrates, such as vegetables, fruits, whole grains, and beans-are healthier than others.   Finally, stay active  Signed, Dub Huntsman, DO  10/12/2024 12:09 PM    [facility] "

## 2024-11-03 ENCOUNTER — Other Ambulatory Visit: Payer: Self-pay

## 2024-11-03 ENCOUNTER — Other Ambulatory Visit (HOSPITAL_BASED_OUTPATIENT_CLINIC_OR_DEPARTMENT_OTHER): Payer: Self-pay

## 2024-11-03 MED ORDER — HYDROCODONE-ACETAMINOPHEN 5-325 MG PO TABS
1.0000 | ORAL_TABLET | Freq: Four times a day (QID) | ORAL | 0 refills | Status: AC | PRN
  Filled 2024-11-03: qty 120, 30d supply, fill #0

## 2024-11-07 ENCOUNTER — Other Ambulatory Visit: Payer: Self-pay | Admitting: Cardiology

## 2024-11-08 ENCOUNTER — Other Ambulatory Visit (HOSPITAL_BASED_OUTPATIENT_CLINIC_OR_DEPARTMENT_OTHER): Payer: Self-pay

## 2024-11-08 MED ORDER — CARVEDILOL 6.25 MG PO TABS
6.2500 mg | ORAL_TABLET | Freq: Two times a day (BID) | ORAL | 1 refills | Status: AC
  Filled 2024-11-08: qty 180, 90d supply, fill #0

## 2024-11-10 ENCOUNTER — Encounter: Payer: Self-pay | Admitting: Cardiology

## 2024-11-12 NOTE — Telephone Encounter (Signed)
 S/w the patient and she reports that the event lasted a few hours- dizziness, weak, sob,  I felt weakB/P 101/76, P. 120. Had cold sweat. Pulse irregular - then she was able to go to bed She was getting ready to get in the shower, so she held off on it due to feeling so bad.   She reports that she was eating and drinking well throughout the day  She has been ok since then. No events since 1/21- Wednesday.  Her BP the next morning was 140/78 pulse 68. This was before her morning meds. She does keep a log of her BP's. She reports that she has an upcoming Echo scheduled. She has a hx of PSVT and PVC'S.  Informed her that I would send this information to her provider and let her know of any recommendations. Instructed her to continue to monitor BP and HR, stay well hydrated and eat regularly; given ER precautions. She verbalized understanding.

## 2024-11-18 ENCOUNTER — Ambulatory Visit (HOSPITAL_COMMUNITY)
Admission: RE | Admit: 2024-11-18 | Discharge: 2024-11-18 | Disposition: A | Discharge status: Home / Self Care | Source: Ambulatory Visit | Attending: Cardiology | Admitting: Cardiology

## 2024-11-18 DIAGNOSIS — I350 Nonrheumatic aortic (valve) stenosis: Secondary | ICD-10-CM | POA: Insufficient documentation

## 2024-11-18 LAB — ECHOCARDIOGRAM COMPLETE
AR max vel: 0.81 cm2
AV Area VTI: 0.78 cm2
AV Area mean vel: 0.75 cm2
AV Mean grad: 27 mmHg
AV Peak grad: 41.2 mmHg
Ao pk vel: 3.21 m/s
Area-P 1/2: 4.57 cm2
MV VTI: 1.98 cm2
P 1/2 time: 571 ms
S' Lateral: 2 cm

## 2024-11-24 ENCOUNTER — Other Ambulatory Visit: Payer: Self-pay | Admitting: Internal Medicine

## 2024-11-24 DIAGNOSIS — Z1231 Encounter for screening mammogram for malignant neoplasm of breast: Secondary | ICD-10-CM

## 2024-11-26 ENCOUNTER — Ambulatory Visit: Payer: Self-pay | Admitting: Cardiology

## 2025-01-05 ENCOUNTER — Ambulatory Visit
